# Patient Record
Sex: Female | Born: 1960 | Race: Black or African American | Hispanic: No | Marital: Single | Smoking: Never smoker
Health system: Southern US, Community
[De-identification: ages and names within clinical notes are randomized; demographics above are authoritative.]

## PROBLEM LIST (undated history)

## (undated) DIAGNOSIS — D0510 Intraductal carcinoma in situ of unspecified breast: Secondary | ICD-10-CM

## (undated) DIAGNOSIS — D219 Benign neoplasm of connective and other soft tissue, unspecified: Secondary | ICD-10-CM

## (undated) DIAGNOSIS — C50919 Malignant neoplasm of unspecified site of unspecified female breast: Secondary | ICD-10-CM

## (undated) DIAGNOSIS — C50119 Malignant neoplasm of central portion of unspecified female breast: Principal | ICD-10-CM

## (undated) HISTORY — PX: NASAL SINUS SURGERY: SHX719

## (undated) HISTORY — DX: Benign neoplasm of connective and other soft tissue, unspecified: D21.9

## (undated) HISTORY — DX: Malignant neoplasm of unspecified site of unspecified female breast: C50.919

## (undated) HISTORY — DX: Intraductal carcinoma in situ of unspecified breast: D05.10

---

## 2008-02-18 ENCOUNTER — Emergency Department (HOSPITAL_COMMUNITY): Admission: EM | Admit: 2008-02-18 | Discharge: 2008-02-18 | Payer: Self-pay | Admitting: Family Medicine

## 2008-08-07 ENCOUNTER — Ambulatory Visit (HOSPITAL_COMMUNITY): Admission: RE | Admit: 2008-08-07 | Discharge: 2008-08-07 | Payer: Self-pay | Admitting: Gastroenterology

## 2008-08-07 ENCOUNTER — Encounter (INDEPENDENT_AMBULATORY_CARE_PROVIDER_SITE_OTHER): Payer: Self-pay | Admitting: Gastroenterology

## 2008-09-29 HISTORY — PX: HERNIA REPAIR: SHX51

## 2008-11-01 ENCOUNTER — Emergency Department (HOSPITAL_COMMUNITY): Admission: EM | Admit: 2008-11-01 | Discharge: 2008-11-01 | Payer: Self-pay | Admitting: Family Medicine

## 2009-02-06 ENCOUNTER — Emergency Department (HOSPITAL_COMMUNITY): Admission: EM | Admit: 2009-02-06 | Discharge: 2009-02-06 | Payer: Self-pay | Admitting: Family Medicine

## 2009-04-26 ENCOUNTER — Emergency Department (HOSPITAL_COMMUNITY): Admission: EM | Admit: 2009-04-26 | Discharge: 2009-04-26 | Payer: Self-pay | Admitting: Emergency Medicine

## 2009-06-28 ENCOUNTER — Ambulatory Visit (HOSPITAL_COMMUNITY): Admission: RE | Admit: 2009-06-28 | Discharge: 2009-06-28 | Payer: Self-pay | Admitting: General Surgery

## 2010-12-25 ENCOUNTER — Emergency Department (HOSPITAL_COMMUNITY): Payer: 59

## 2010-12-25 ENCOUNTER — Emergency Department (HOSPITAL_COMMUNITY)
Admission: EM | Admit: 2010-12-25 | Discharge: 2010-12-25 | Disposition: A | Discharge status: Home / Self Care | Payer: 59 | Attending: Emergency Medicine | Admitting: Emergency Medicine

## 2010-12-25 ENCOUNTER — Encounter (HOSPITAL_COMMUNITY): Payer: Self-pay | Admitting: Radiology

## 2010-12-25 DIAGNOSIS — R197 Diarrhea, unspecified: Secondary | ICD-10-CM | POA: Insufficient documentation

## 2010-12-25 DIAGNOSIS — R1031 Right lower quadrant pain: Secondary | ICD-10-CM | POA: Insufficient documentation

## 2010-12-25 DIAGNOSIS — D259 Leiomyoma of uterus, unspecified: Secondary | ICD-10-CM | POA: Insufficient documentation

## 2010-12-25 LAB — URINALYSIS, ROUTINE W REFLEX MICROSCOPIC
Bilirubin Urine: NEGATIVE
Nitrite: NEGATIVE
Specific Gravity, Urine: 1.019 (ref 1.005–1.030)
pH: 6.5 (ref 5.0–8.0)

## 2010-12-25 LAB — WET PREP, GENITAL
Clue Cells Wet Prep HPF POC: NONE SEEN
Trich, Wet Prep: NONE SEEN

## 2010-12-25 LAB — COMPREHENSIVE METABOLIC PANEL
ALT: 15 U/L (ref 0–35)
AST: 17 U/L (ref 0–37)
Calcium: 9.4 mg/dL (ref 8.4–10.5)
Creatinine, Ser: 0.79 mg/dL (ref 0.4–1.2)
GFR calc Af Amer: >60 mL/min (ref >60)
Sodium: 139 mEq/L (ref 135–145)
Total Protein: 7.2 g/dL (ref 6.0–8.3)

## 2010-12-25 LAB — CBC
HCT: 34.1 % — ABNORMAL LOW (ref 36.0–46.0)
Hemoglobin: 11 g/dL — ABNORMAL LOW (ref 12.0–15.0)
RBC: 4.71 MIL/uL (ref 3.87–5.11)
WBC: 4.4 10*3/uL (ref 4.0–10.5)

## 2010-12-25 LAB — DIFFERENTIAL
Basophils Absolute: 0 10*3/uL (ref 0.0–0.1)
Lymphocytes Relative: 22 % (ref 12–46)
Neutro Abs: 3 10*3/uL (ref 1.7–7.7)
Neutrophils Relative %: 69 % (ref 43–77)

## 2010-12-25 MED ORDER — IOHEXOL 300 MG/ML  SOLN
95.0000 mL | Freq: Once | INTRAMUSCULAR | Status: AC | PRN
  Administered 2010-12-25: 95 mL via INTRAVENOUS

## 2010-12-26 LAB — GC/CHLAMYDIA PROBE AMP, GENITAL: GC Probe Amp, Genital: NEGATIVE

## 2011-01-03 LAB — DIFFERENTIAL
Basophils Relative: 0 % (ref 0–1)
Lymphocytes Relative: 37 % (ref 12–46)
Lymphs Abs: 1.5 10*3/uL (ref 0.7–4.0)
Monocytes Absolute: 0.5 10*3/uL (ref 0.1–1.0)
Monocytes Relative: 13 % — ABNORMAL HIGH (ref 3–12)
Neutro Abs: 2 10*3/uL (ref 1.7–7.7)
Neutrophils Relative %: 49 % (ref 43–77)

## 2011-01-03 LAB — CBC
Hemoglobin: 11 g/dL — ABNORMAL LOW (ref 12.0–15.0)
MCHC: 32.6 g/dL (ref 30.0–36.0)
RBC: 4.64 MIL/uL (ref 3.87–5.11)
WBC: 4 10*3/uL (ref 4.0–10.5)

## 2011-01-03 LAB — BASIC METABOLIC PANEL
CO2: 23 mEq/L (ref 19–32)
Calcium: 8.6 mg/dL (ref 8.4–10.5)
Chloride: 106 mEq/L (ref 96–112)
GFR calc Af Amer: >60 mL/min (ref >60)
Sodium: 137 mEq/L (ref 135–145)

## 2011-01-03 LAB — PREGNANCY, URINE: Preg Test, Ur: NEGATIVE

## 2011-01-05 LAB — CBC
HCT: 35.9 % — ABNORMAL LOW (ref 36.0–46.0)
Hemoglobin: 11.9 g/dL — ABNORMAL LOW (ref 12.0–15.0)
MCHC: 33.1 g/dL (ref 30.0–36.0)
MCV: 71.1 fL — ABNORMAL LOW (ref 78.0–100.0)
Platelets: 252 10*3/uL (ref 150–400)
RDW: 18.6 % — ABNORMAL HIGH (ref 11.5–15.5)

## 2011-01-05 LAB — BASIC METABOLIC PANEL
BUN: 11 mg/dL (ref 6–23)
CO2: 23 mEq/L (ref 19–32)
Chloride: 107 mEq/L (ref 96–112)
Glucose, Bld: 120 mg/dL — ABNORMAL HIGH (ref 70–99)
Potassium: 3.9 mEq/L (ref 3.5–5.1)
Sodium: 136 mEq/L (ref 135–145)

## 2011-01-05 LAB — COMPREHENSIVE METABOLIC PANEL
ALT: 13 U/L (ref 0–35)
Alkaline Phosphatase: 67 U/L (ref 39–117)
BUN: 12 mg/dL (ref 6–23)
CO2: 21 mEq/L (ref 19–32)
Calcium: 9.1 mg/dL (ref 8.4–10.5)
GFR calc non Af Amer: >60 mL/min (ref >60)
Glucose, Bld: 113 mg/dL — ABNORMAL HIGH (ref 70–99)
Potassium: 3.9 mEq/L (ref 3.5–5.1)
Sodium: 136 mEq/L (ref 135–145)

## 2011-01-05 LAB — URINALYSIS, ROUTINE W REFLEX MICROSCOPIC
Bilirubin Urine: NEGATIVE
Ketones, ur: 15 mg/dL — AB
Nitrite: NEGATIVE
Protein, ur: NEGATIVE mg/dL
pH: 5.5 (ref 5.0–8.0)

## 2011-01-05 LAB — DIFFERENTIAL
Basophils Absolute: 0 10*3/uL (ref 0.0–0.1)
Eosinophils Absolute: 0 10*3/uL (ref 0.0–0.7)
Eosinophils Relative: 1 % (ref 0–5)
Lymphocytes Relative: 38 % (ref 12–46)
Monocytes Absolute: 0.4 10*3/uL (ref 0.1–1.0)

## 2011-01-05 LAB — LIPASE, BLOOD: Lipase: 18 U/L (ref 11–59)

## 2011-01-05 LAB — GC/CHLAMYDIA PROBE AMP, GENITAL: GC Probe Amp, Genital: NEGATIVE

## 2011-02-11 NOTE — Op Note (Signed)
NAME:  Angelica Hogan, Angelica Hogan NO.:  [id number]   MEDICAL RECORD NO.:  [id number]          PATIENT TYPE:  AMB   LOCATION:  ENDO                         FACILITY:  [facility]   PHYSICIAN:  Anselmo Rod, M.D.  DATE OF BIRTH:  July 16, 1961   DATE OF PROCEDURE:  08/07/2008  DATE OF DISCHARGE:                               OPERATIVE REPORT   PROCEDURE PERFORMED:  Colonoscopy with multiple cold biopsies and snare  polypectomy x1.   ENDOSCOPIST:  Anselmo Rod.   INSTRUMENT USED:  Pentax video colonoscope.   INDICATIONS FOR PROCEDURE:  A 50 year old white female with a history of  change in bowel habits, worsening constipation and abdominal pain,  undergoing colonoscopy to rule out colonic polyps, masses, etc.   PREPROCEDURE PREPARATION:  Informed consent was procured from the  patient.  The patient fasted for 8 hours prior to the procedure after  being prepped with a bottle of magnesium citrate and a gallon of  NuLytely the night prior to the procedure.  The risks and benefits of  the procedure including 10% miss rates of cancer and polyps were  discussed with the patient as well.   PREPROCEDURE PHYSICAL:  The patient had stable vital signs.  Neck  supple.  Lungs clear to auscultation.  S1, S2 regular.  Abdomen soft  with normal bowel sounds.   DESCRIPTION OF PROCEDURE:  The patient was placed in the left lateral  decubitus position and sedated with 100 mcg of fentanyl, 10 mg of Versed  and 5  mg of Benadryl given in slow and incremental doses.  Once the  patient was adequately sedated and maintained on low-flow oxygen and  continuous cardiac monitoring, the Pentax video colonoscope was advanced  from the rectum to the cecum, where a flat polyp was removed by hot  snare (150/15 from the cecum).  A small sessile polyp was removed by  cold biopsy x2 from the rectosigmoid colon.  The rest of the exam was  unremarkable.  No other masses or polyps were seen.  The orifice and  ileocecal valve were clearly visualized and photographed.  The terminal  ileum appeared healthy.  Retroflexion revealed no abnormalities.  The  patient tolerated the procedure well without immediate complications.   IMPRESSION:  1. A flat polyp removed by hot snare from the cecum.  2. A small sessile rectosigmoid polyp removed by cold biopsy x  4.  3. Otherwise normal exam of the terminal ileum.   RECOMMENDATIONS:  1. Await pathology results.  2. Avoid all nonsteroidals for the next 2 weeks.  3. Outpatient followup in the next 2 weeks and further      recommendations.      Anselmo Rod, M.D.  Electronically Signed     JNM/MEDQ  D:  08/07/2008  T:  08/08/2008  Job:  [id number]

## 2011-04-30 ENCOUNTER — Other Ambulatory Visit: Payer: Self-pay | Admitting: Radiology

## 2011-04-30 DIAGNOSIS — D0510 Intraductal carcinoma in situ of unspecified breast: Secondary | ICD-10-CM

## 2011-04-30 HISTORY — DX: Intraductal carcinoma in situ of unspecified breast: D05.10

## 2011-05-02 ENCOUNTER — Other Ambulatory Visit: Payer: Self-pay | Admitting: Radiology

## 2011-05-02 DIAGNOSIS — C50919 Malignant neoplasm of unspecified site of unspecified female breast: Secondary | ICD-10-CM

## 2011-05-02 DIAGNOSIS — C50911 Malignant neoplasm of unspecified site of right female breast: Secondary | ICD-10-CM

## 2011-05-05 ENCOUNTER — Ambulatory Visit (HOSPITAL_COMMUNITY)
Admission: RE | Admit: 2011-05-05 | Discharge: 2011-05-05 | Disposition: A | Discharge status: Home / Self Care | Payer: 59 | Source: Ambulatory Visit | Attending: Radiology | Admitting: Radiology

## 2011-05-05 DIAGNOSIS — Y839 Surgical procedure, unspecified as the cause of abnormal reaction of the patient, or of later complication, without mention of misadventure at the time of the procedure: Secondary | ICD-10-CM | POA: Insufficient documentation

## 2011-05-05 DIAGNOSIS — IMO0002 Reserved for concepts with insufficient information to code with codable children: Secondary | ICD-10-CM | POA: Insufficient documentation

## 2011-05-05 DIAGNOSIS — C50911 Malignant neoplasm of unspecified site of right female breast: Secondary | ICD-10-CM

## 2011-05-05 DIAGNOSIS — D0590 Unspecified type of carcinoma in situ of unspecified breast: Secondary | ICD-10-CM | POA: Insufficient documentation

## 2011-05-05 MED ORDER — GADOBENATE DIMEGLUMINE 529 MG/ML IV SOLN
20.0000 mL | Freq: Once | INTRAVENOUS | Status: AC | PRN
  Administered 2011-05-05: 20 mL via INTRAVENOUS

## 2011-05-06 ENCOUNTER — Other Ambulatory Visit: Payer: 59

## 2011-05-07 ENCOUNTER — Encounter (INDEPENDENT_AMBULATORY_CARE_PROVIDER_SITE_OTHER): Payer: Self-pay | Admitting: Surgery

## 2011-05-07 ENCOUNTER — Other Ambulatory Visit: Payer: Self-pay | Admitting: Oncology

## 2011-05-07 ENCOUNTER — Ambulatory Visit (HOSPITAL_BASED_OUTPATIENT_CLINIC_OR_DEPARTMENT_OTHER): Payer: 59 | Admitting: Surgery

## 2011-05-07 ENCOUNTER — Encounter (HOSPITAL_BASED_OUTPATIENT_CLINIC_OR_DEPARTMENT_OTHER): Payer: 59 | Admitting: Oncology

## 2011-05-07 VITALS — BP 145/78 | HR 74 | Temp 99.0°F | Resp 20 | Ht 65.2 in | Wt 186.7 lb

## 2011-05-07 DIAGNOSIS — D0590 Unspecified type of carcinoma in situ of unspecified breast: Secondary | ICD-10-CM

## 2011-05-07 LAB — CBC WITH DIFFERENTIAL/PLATELET
EOS%: 2.6 % (ref 0.0–7.0)
Eosinophils Absolute: 0.1 10*3/uL (ref 0.0–0.5)
LYMPH%: 33.5 % (ref 14.0–49.7)
MCH: 23.6 pg — ABNORMAL LOW (ref 25.1–34.0)
MCHC: 32.1 g/dL (ref 31.5–36.0)
MCV: 73.4 fL — ABNORMAL LOW (ref 79.5–101.0)
MONO%: 8.5 % (ref 0.0–14.0)
NEUT#: 2.1 10*3/uL (ref 1.5–6.5)
Platelets: 235 10*3/uL (ref 145–400)
RBC: 4.96 10*6/uL (ref 3.70–5.45)
RDW: 18.1 % — ABNORMAL HIGH (ref 11.2–14.5)
nRBC: 0 % (ref 0–0)

## 2011-05-07 LAB — COMPREHENSIVE METABOLIC PANEL
ALT: 11 U/L (ref 0–35)
Alkaline Phosphatase: 47 U/L (ref 39–117)
CO2: 23 mEq/L (ref 19–32)
Creatinine, Ser: 0.73 mg/dL (ref 0.50–1.10)
Total Bilirubin: 0.3 mg/dL (ref 0.3–1.2)

## 2011-05-07 NOTE — Assessment & Plan Note (Signed)
To be seen today for [place] image of 2nd right breast lesion at [facility]. Genetic testing 05/12/11. Schedule lumpectomy early September.

## 2011-05-07 NOTE — Progress Notes (Signed)
Chief Complaint  Patient presents with  . Breast Cancer    Angelica Hogan is a 50 y.o. AA female who is a patient of Angelica Hogan. and comes to me today for Right breast cancer.  The patient sees Dr. Lisbeth Hogan has her OB/GYN doctor. She went for her routine mammogram. She did not feel a mass in her breast. She has had multiple members of her family has had breast cancer. This includes her grandmother, 2 aunts on her father's side, and 2 cousins on her father's side.  She is on Loestrin. She denies to stop this. She still having regular periods.  LMP 04/23/11.  She does have uterine fibroids. She has one son, Angelica Hogan, who accompanied her today.   Path (side, TNM): Right, Low Grade DCIS Surgery: pending  Date: pending Size of tumor: getting a 2nd biopsy  Nodes:  ER: 100% PR: 16% Ki67:   HER2Neu:   Medical Oncologist: Angelica Hogan Radiation Oncologist: Angelica Hogan   Past Medical History  Diagnosis Date  . Fibroids     Past Surgical History  Procedure Date  . Hernia repair 2010  . Nasal sinus surgery     1980's    Current Outpatient Prescriptions  Medication Sig    . acetaminophen (TYLENOL) 325 MG tablet Take 650 mg by mouth as needed.        . calcium carbonate (TUMS - DOSED IN MG ELEMENTAL CALCIUM) 500 MG chewable tablet Chew 1 tablet by mouth as needed.         Review of Systems: Skin:  Angelica history of rash.  Angelica history of abnormal moles. Infection:  Angelica history of hepatitis or HIV.  Angelica history of MRSA. Neurologic:  Angelica history of stroke.  Angelica history of seizure.  Angelica history of headaches. Cardiac:  Angelica history of hypertension. Angelica history of heart disease.  Angelica history of prior cardiac catheterization.  Angelica history of seeing a cardiologist. Pulmonary:  Does not smoke cigarettes.  Angelica asthma or bronchitis.  Endocrine:  Angelica diabetes. Angelica thyroid disease. Gastrointestinal:  Angelica history of stomach disease.  Angelica history of liver disease.  Angelica history of gall bladder disease.  Angelica  history of pancreas disease.  Angelica history of colon disease.  Umbilical hernia repaired by Dr. Dwain Hogan in 2010. Urologic:  Angelica history of kidney stones.  Angelica history of bladder infections. Musculoskeletal:  Angelica history of joint or back disease. Hematologic:  Angelica bleeding disorder.  Angelica history of anemia.  Not anticoagulated.   Angelica Known Allergies  SOCIAL and FAMILY HISTORY: Works at [employer] as Scientist, physiological. Accompanied by son, Angelica Hogan.  She is divorced.  PHYSICAL EXAM: BP 145/78  Pulse 74  Temp 99 F (37.2 C)  Resp 20  Ht 5' 5.2" (1.656 m)  Wt 186 lb 11.2 oz (84.687 kg)  BMI 30.88 kg/m2  HEENT: Normal. Pupils equal. Normal dentition. Neck: Supple. Angelica thyroid mass. Lymph Nodes:  Angelica supraclavicular or cervical nodes. Angelica axillary adenopathy. Breast: She has a bruise in her right breast at the 12 position. She has a small mass associate a probable hematoma. I feel Angelica other mass in her left or right breast. She has Angelica nipple discharge.  Lungs: Clear and symmetric. Heart:  RRR. Angelica murmur. Abdomen: Angelica mass. Angelica tenderness. Angelica hernia. Normal bowel sounds.  Angelica abdominal scars. Rectal: Not done. Extremities:  Good strength in upper and lower extremities. Neurologic:  Grossly intact to motor and sensory function.   DATA REVIEWED: [facility] and x-rays reviewed.  ASSESSMENT and PLAN: 1.  Right breast Cancer, DCIS, low grade  Candidate for lumpectomy  Does not need SLNBx  I discussed the indications and complications of lumpectomy with the patient and her son. I discussed needle localization of the lumpectomy. She did not need a sentinel lymph node biopsy. I discussed the idea of margins.  The risks of surgery includes bleeding, infection, the need for further surgery, and nerve injury.  The surgery is usually done as an outpatient.  I will plan to schedule her surgery in early September to allow for the results of the genetic testing to be reviewed.  2.   Second area in right breast on MRI  For [place] eval today at [facility]  3.  Genetic testing  To be seen 05/12/2011  4.  To stop estrogen

## 2011-05-08 ENCOUNTER — Other Ambulatory Visit: Payer: 59

## 2011-05-12 ENCOUNTER — Encounter: Payer: 59 | Admitting: Genetic Counselor

## 2011-06-10 ENCOUNTER — Ambulatory Visit
Admission: RE | Admit: 2011-06-10 | Discharge: 2011-06-10 | Disposition: A | Discharge status: Home / Self Care | Payer: 59 | Source: Ambulatory Visit | Attending: Surgery | Admitting: Surgery

## 2011-06-10 ENCOUNTER — Other Ambulatory Visit (INDEPENDENT_AMBULATORY_CARE_PROVIDER_SITE_OTHER): Payer: Self-pay | Admitting: Surgery

## 2011-06-10 ENCOUNTER — Encounter (HOSPITAL_BASED_OUTPATIENT_CLINIC_OR_DEPARTMENT_OTHER)
Admission: RE | Admit: 2011-06-10 | Discharge: 2011-06-10 | Disposition: A | Discharge status: Home / Self Care | Payer: 59 | Source: Ambulatory Visit | Attending: Surgery | Admitting: Surgery

## 2011-06-10 DIAGNOSIS — Z01811 Encounter for preprocedural respiratory examination: Secondary | ICD-10-CM

## 2011-06-10 LAB — DIFFERENTIAL
Basophils Relative: 1 % (ref 0–1)
Eosinophils Absolute: 0 10*3/uL (ref 0.0–0.7)
Neutrophils Relative %: 44 % (ref 43–77)

## 2011-06-10 LAB — BASIC METABOLIC PANEL
CO2: 24 mEq/L (ref 19–32)
Calcium: 9.2 mg/dL (ref 8.4–10.5)
Chloride: 105 mEq/L (ref 96–112)
Sodium: 139 mEq/L (ref 135–145)

## 2011-06-10 LAB — CBC
Platelets: 226 10*3/uL (ref 150–400)
RBC: 4.48 MIL/uL (ref 3.87–5.11)
WBC: 3.3 10*3/uL — ABNORMAL LOW (ref 4.0–10.5)

## 2011-06-11 NOTE — Progress Notes (Signed)
Quick Note:  These labs are OK for surgery. ______ 

## 2011-06-12 ENCOUNTER — Ambulatory Visit (HOSPITAL_BASED_OUTPATIENT_CLINIC_OR_DEPARTMENT_OTHER)
Admission: RE | Admit: 2011-06-12 | Discharge: 2011-06-12 | Disposition: A | Discharge status: Home / Self Care | Payer: 59 | Source: Ambulatory Visit | Attending: Surgery | Admitting: Surgery

## 2011-06-12 ENCOUNTER — Other Ambulatory Visit (INDEPENDENT_AMBULATORY_CARE_PROVIDER_SITE_OTHER): Payer: Self-pay | Admitting: Surgery

## 2011-06-12 DIAGNOSIS — Z01812 Encounter for preprocedural laboratory examination: Secondary | ICD-10-CM | POA: Insufficient documentation

## 2011-06-12 DIAGNOSIS — K219 Gastro-esophageal reflux disease without esophagitis: Secondary | ICD-10-CM | POA: Insufficient documentation

## 2011-06-12 DIAGNOSIS — Z01818 Encounter for other preprocedural examination: Secondary | ICD-10-CM | POA: Insufficient documentation

## 2011-06-12 DIAGNOSIS — D0590 Unspecified type of carcinoma in situ of unspecified breast: Secondary | ICD-10-CM | POA: Insufficient documentation

## 2011-06-12 DIAGNOSIS — E669 Obesity, unspecified: Secondary | ICD-10-CM | POA: Insufficient documentation

## 2011-06-12 DIAGNOSIS — Z01810 Encounter for preprocedural cardiovascular examination: Secondary | ICD-10-CM | POA: Insufficient documentation

## 2011-06-12 HISTORY — PX: BREAST LUMPECTOMY: SHX2

## 2011-06-16 ENCOUNTER — Encounter (INDEPENDENT_AMBULATORY_CARE_PROVIDER_SITE_OTHER): Payer: Self-pay

## 2011-06-17 NOTE — Op Note (Signed)
  NAME:  Angelica Hogan, Angelica Hogan NO.:  [id number]  MEDICAL RECORD NO.:  [id number]  LOCATION:                                 FACILITY:  PHYSICIAN:  Sandria Bales. Ezzard Standing, M.D.  DATE OF BIRTH:  [birth year]  DATE OF PROCEDURE:  06/12/2011                              OPERATIVE REPORT   DATE OF SURGERY:  June 12, 2011.  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  POSTOPERATIVE DIAGNOSIS:  Ductal carcinoma in situ of the right breast at 12 o'clock position ("almost subareolar").  PROCEDURE:  Right breast lumpectomy, needle localization.  SURGEON:  Sandria Bales. Ezzard Standing, MD.  FIRST ASSISTANT:  None.  ANESTHESIA:  General endotracheal with 30 mL of 0.25% Marcaine.  COMPLICATIONS:  None.  INDICATIONS FOR PROCEDURE:  [name] is a 50 year old African American female who presented with a ductal carcinoma in situ of the right breast.  This is ER 100%, PR 16%, she has seen Dr. Pierce Crane from both medical and oncology standpoint, Dr. Lurline Hare for Radiation Oncology and sees Dr. Noland Fordyce for her sort of primary care.  I discussed with the patient about options for treatment.  I discussed about proceeding with lumpectomy.  I do not think she needs a sentinel lymph node biopsy.  I discussed the indications, potential complications of lumpectomy.  Potential complication of lumpectomy include, but are not limited to, bleeding, infection, the need for possible more surgery, and nerve injury.  OPERATIVE COURSE:  The patient came from [facility] with a wire in her right breast coming from the 12-1 o'clock position about 45 cm above the areola.  She was placed under general anesthesia.  Her right breast was prepped with ChloraPrep and sterilely draped.    A time-out was held and surgical checklist run.  It looked like this clip in the microcalcifications were sort of deep in the breast out through a 4 cm  just above the edge of the 12 o'clock position of the right areola.  I excised a block of breast tissue approximately 3-4 cm diameter.  I painted this with a six-color paint kit.  I shot a specimen mammogram which confirmed the clip in the wire in the middle of the specimen.  This was sent to pathology for permanent analysis.  I irrigated the wound with saline.  I did place clips at the 3, 6, 9 o'clock and 12 o'clock positions and 1 deep.  I then closed the wound with interrupted 3-0 Vicryl sutures and the skin with a 5-0 Vicryl suture, painted the skin with Dermabond and sterilely dressed it.  The patient tolerated procedure well, was transported to recovery room in good condition.  Sponge and needle counts were correct at the end of the case.   Sandria Bales. Ezzard Standing, M.D., FACS    DHN/MEDQ  D:  06/12/2011  T:  06/12/2011  Job:  [id number]  cc:   Lendon Colonel, MD Pierce Crane, M.D., F.R.C.P.C. Lurline Hare, M.D.  Electronically Signed by Ovidio Kin M.D. on 06/17/2011 02:40:45 PM

## 2011-06-18 ENCOUNTER — Encounter (INDEPENDENT_AMBULATORY_CARE_PROVIDER_SITE_OTHER): Payer: Self-pay | Admitting: Surgery

## 2011-06-18 ENCOUNTER — Ambulatory Visit (INDEPENDENT_AMBULATORY_CARE_PROVIDER_SITE_OTHER): Payer: 59 | Admitting: Surgery

## 2011-06-18 VITALS — BP 124/82 | HR 80 | Temp 97.5°F | Resp 16 | Ht 65.0 in | Wt 187.0 lb

## 2011-06-18 DIAGNOSIS — Z853 Personal history of malignant neoplasm of breast: Secondary | ICD-10-CM

## 2011-06-18 NOTE — Progress Notes (Signed)
MDBC  ASSESSMENT AND PLAN:  1.  Right breast cancer, DCIS.   0.5 cm in diameter.  ER/PR positive.  To see Drs. Rubin/Wentworth regarding further treatment.  Doing well post op.  I wrote he a note to return to work 06/23/2011.  2.  Second area in right breast on MRI            [place] negative.  To follow.  3.  Genetic testing             Negative.    HPI:  Angelica Hogan is a 50 y.o. AA female who is a patient of Dr. Theodoro Kos on file and comes to me today for Right breast cancer.   The patient sees Dr. Lisbeth Renshaw has her OB/GYN doctor. She went for her routine mammogram which showed an abnormality in the right beast.  She underwent a right breast lumpectomy 06/12/2011 and has an excellent path report.  She has done well with little discomfort.  She has an appointment to see Dr. Donnie Coffin.   Path (side, TNM): Tis, Right, Low Grade DCIS  Surgery: right breast lumpectomy  Date: 06/12/2011  Size of tumor:  0.5 cm Nodes: -/- ER: 100%  PR: 70%  Ki67:    HER2Neu:  Medical Oncologist: Donnie Coffin   Radiation Oncologist: Michell Heinrich  SOCIAL and FAMILY HISTORY: Works at [employer] as Scientist, physiological.  PHYSICAL EXAM: BP 124/82  Pulse 80  Temp(Src) 97.5 F (36.4 C) (Temporal)  Resp 16  Ht 5\' 5"  (1.651 m)  Wt 187 lb (84.823 kg)  BMI 31.12 kg/m2  Right breast:  Wound looks good.  Minimal bruising.

## 2011-06-26 ENCOUNTER — Ambulatory Visit
Admission: RE | Admit: 2011-06-26 | Discharge: 2011-06-26 | Disposition: A | Discharge status: Home / Self Care | Payer: 59 | Source: Ambulatory Visit | Attending: Radiation Oncology | Admitting: Radiation Oncology

## 2011-06-26 DIAGNOSIS — D0590 Unspecified type of carcinoma in situ of unspecified breast: Secondary | ICD-10-CM | POA: Insufficient documentation

## 2011-06-26 DIAGNOSIS — Z510 Encounter for antineoplastic radiation therapy: Secondary | ICD-10-CM | POA: Insufficient documentation

## 2011-07-11 ENCOUNTER — Encounter (HOSPITAL_BASED_OUTPATIENT_CLINIC_OR_DEPARTMENT_OTHER): Payer: 59 | Admitting: Oncology

## 2011-07-11 ENCOUNTER — Encounter: Payer: Self-pay | Admitting: *Deleted

## 2011-07-11 DIAGNOSIS — C50119 Malignant neoplasm of central portion of unspecified female breast: Secondary | ICD-10-CM

## 2011-07-11 DIAGNOSIS — D0590 Unspecified type of carcinoma in situ of unspecified breast: Secondary | ICD-10-CM

## 2011-07-14 ENCOUNTER — Encounter: Payer: Self-pay | Admitting: *Deleted

## 2011-07-15 ENCOUNTER — Encounter: Payer: 59 | Admitting: Oncology

## 2011-08-04 ENCOUNTER — Ambulatory Visit
Admission: RE | Admit: 2011-08-04 | Discharge: 2011-08-04 | Disposition: A | Discharge status: Home / Self Care | Payer: 59 | Source: Ambulatory Visit | Attending: Radiation Oncology | Admitting: Radiation Oncology

## 2011-08-05 ENCOUNTER — Ambulatory Visit
Admission: RE | Admit: 2011-08-05 | Discharge: 2011-08-05 | Disposition: A | Discharge status: Home / Self Care | Payer: 59 | Source: Ambulatory Visit | Attending: Radiation Oncology | Admitting: Radiation Oncology

## 2011-08-05 VITALS — Wt 190.0 lb

## 2011-08-05 DIAGNOSIS — D0590 Unspecified type of carcinoma in situ of unspecified breast: Secondary | ICD-10-CM

## 2011-08-05 NOTE — Progress Notes (Signed)
Weekly Management Note Current Dose: Gy  Projected Dose: Gy   Narrative:  The patient presents for routine under treatment assessment.  CBCT/MVCT images/Port film x-rays were reviewed.  The chart was checked. Some irritation of rt breast. Nausea in am. Eating more than normal.   Physical Findings: Unchanged. Some thin skin of rt breast. Small amount of breakdown in rt inframmary fold. Wt190 lb (86.183 kg)  Impression:  The patient is tolerating radiation.  Plan:  Continue treatment as planned. Stop underwire bra. Use Zantac bid for possible GERD.

## 2011-08-05 NOTE — Progress Notes (Signed)
Slight erythema ,tanning r breast, slight fatigue, hearty appetite, skin intact,no c/o pain

## 2011-08-06 ENCOUNTER — Encounter: Payer: Self-pay | Admitting: *Deleted

## 2011-08-06 ENCOUNTER — Ambulatory Visit
Admission: RE | Admit: 2011-08-06 | Discharge: 2011-08-06 | Disposition: A | Discharge status: Home / Self Care | Payer: 59 | Source: Ambulatory Visit | Attending: Radiation Oncology | Admitting: Radiation Oncology

## 2011-08-06 ENCOUNTER — Other Ambulatory Visit: Payer: Self-pay | Admitting: *Deleted

## 2011-08-06 DIAGNOSIS — D0590 Unspecified type of carcinoma in situ of unspecified breast: Secondary | ICD-10-CM

## 2011-08-06 NOTE — Progress Notes (Signed)
[id number] Week 3 visit Patient in to clinic today for radiation treatment. Met with patient prior to treatment to obtain photographs per protocol. Patient reports that she is using Radiaplex on her skin. Skin shows minimal redness/irritation and she denies and skin discomfort (grade 1 skin changes). Patient given appointment for last day research blood and urine labs to be done on 09/01/2011 prior to radiation therapy to allow time for processing samples. Patient aware that she will also complete questionnaires and have photographs taken on that day. Patient is in agreement with plan.

## 2011-08-07 ENCOUNTER — Ambulatory Visit
Admission: RE | Admit: 2011-08-07 | Discharge: 2011-08-07 | Disposition: A | Discharge status: Home / Self Care | Payer: 59 | Source: Ambulatory Visit | Attending: Radiation Oncology | Admitting: Radiation Oncology

## 2011-08-08 ENCOUNTER — Ambulatory Visit
Admission: RE | Admit: 2011-08-08 | Discharge: 2011-08-08 | Disposition: A | Discharge status: Home / Self Care | Payer: 59 | Source: Ambulatory Visit | Attending: Radiation Oncology | Admitting: Radiation Oncology

## 2011-08-11 ENCOUNTER — Ambulatory Visit
Admission: RE | Admit: 2011-08-11 | Discharge: 2011-08-11 | Disposition: A | Discharge status: Home / Self Care | Payer: 59 | Source: Ambulatory Visit | Attending: Radiation Oncology | Admitting: Radiation Oncology

## 2011-08-12 ENCOUNTER — Ambulatory Visit
Admission: RE | Admit: 2011-08-12 | Discharge: 2011-08-12 | Disposition: A | Discharge status: Home / Self Care | Payer: 59 | Source: Ambulatory Visit | Attending: Radiation Oncology | Admitting: Radiation Oncology

## 2011-08-12 VITALS — Wt 190.4 lb

## 2011-08-12 DIAGNOSIS — C50119 Malignant neoplasm of central portion of unspecified female breast: Secondary | ICD-10-CM

## 2011-08-12 NOTE — Progress Notes (Signed)
Weekly Management Note Current Dose: 36 Gy  Projected Dose:61 Gy   Narrative:  The patient presents for routine under treatment assessment.  CBCT/MVCT images/Port film x-rays were reviewed.  The chart was checked. Doing well. Some irritation over the weekend. Using radiaplex.  Physical Findings: Weight: 190 lb 6.4 oz (86.365 kg). Slightly tan on right side. No desquamation.   Impression:  The patient is tolerating radiation.  Plan:  Continue treatment as planned.

## 2011-08-12 NOTE — Progress Notes (Signed)
Pt has no c/o, some fatigue.

## 2011-08-13 ENCOUNTER — Ambulatory Visit
Admission: RE | Admit: 2011-08-13 | Discharge: 2011-08-13 | Disposition: A | Discharge status: Home / Self Care | Payer: 59 | Source: Ambulatory Visit | Attending: Radiation Oncology | Admitting: Radiation Oncology

## 2011-08-14 ENCOUNTER — Ambulatory Visit
Admission: RE | Admit: 2011-08-14 | Discharge: 2011-08-14 | Disposition: A | Discharge status: Home / Self Care | Payer: 59 | Source: Ambulatory Visit | Attending: Radiation Oncology | Admitting: Radiation Oncology

## 2011-08-15 ENCOUNTER — Ambulatory Visit
Admission: RE | Admit: 2011-08-15 | Discharge: 2011-08-15 | Disposition: A | Discharge status: Home / Self Care | Payer: 59 | Source: Ambulatory Visit | Attending: Radiation Oncology | Admitting: Radiation Oncology

## 2011-08-16 ENCOUNTER — Ambulatory Visit
Admission: RE | Admit: 2011-08-16 | Discharge: 2011-08-16 | Disposition: A | Discharge status: Home / Self Care | Payer: 59 | Source: Ambulatory Visit | Attending: Radiation Oncology | Admitting: Radiation Oncology

## 2011-08-18 ENCOUNTER — Ambulatory Visit
Admission: RE | Admit: 2011-08-18 | Discharge: 2011-08-18 | Disposition: A | Discharge status: Home / Self Care | Payer: 59 | Source: Ambulatory Visit | Attending: Radiation Oncology | Admitting: Radiation Oncology

## 2011-08-18 ENCOUNTER — Encounter: Payer: Self-pay | Admitting: Radiation Oncology

## 2011-08-18 VITALS — Wt 190.8 lb

## 2011-08-18 DIAGNOSIS — C50119 Malignant neoplasm of central portion of unspecified female breast: Secondary | ICD-10-CM

## 2011-08-18 HISTORY — DX: Malignant neoplasm of central portion of unspecified female breast: C50.119

## 2011-08-18 NOTE — Progress Notes (Signed)
[facility] Radiation Oncology Weekly Treatment Note    Name: Angelica Hogan Date: 08/18/2011 MRN: [id number] DOB: 1960-10-27  Status:outpatient    Current dose: 45Gy  Current fraction: 25  Planned dose: 61Gy  Planned fraction:33Gy   MEDICATIONS:  Current Outpatient Prescriptions  Medication Sig Dispense Refill  . acetaminophen (TYLENOL) 325 MG tablet Take 650 mg by mouth as needed.        . calcium carbonate (TUMS - DOSED IN MG ELEMENTAL CALCIUM) 500 MG chewable tablet Chew 1 tablet by mouth as needed.        . ranitidine (ZANTAC) 150 MG tablet Take 150 mg by mouth 2 (two) times daily.           ALLERGIES: Review of patient's allergies indicates no known allergies.     NARRATIVE: Angelica Hogan was seen today for weekly treatment management. The chart was checked and port films images were reviewed. She is doing well, but reports skin irritation, particularly at inframammary fold.  PHYSICAL EXAMINATION: weight is 190 lb 12.8 oz (86.546 kg).      Breast is hyperpigmented throughout with erythema.  Skin intact.  ASSESSMENT: Patient tolerating treatments well.   PLAN: Continue treatment as planned. Start boost tomorrow.  Use 1% hydrocortisone cream in pruritic areas.

## 2011-08-18 NOTE — Progress Notes (Signed)
Has completed 25/25 right breat treatments. Starts boost on 08/19/11. Breast with moderate discoloration and itching. To continue to apply radiaplex gel but may use hydrocortisone !%.

## 2011-08-19 ENCOUNTER — Ambulatory Visit
Admission: RE | Admit: 2011-08-19 | Discharge: 2011-08-19 | Disposition: A | Discharge status: Home / Self Care | Payer: 59 | Source: Ambulatory Visit | Attending: Radiation Oncology | Admitting: Radiation Oncology

## 2011-08-19 ENCOUNTER — Other Ambulatory Visit: Payer: Self-pay | Admitting: Radiation Oncology

## 2011-08-19 DIAGNOSIS — D0590 Unspecified type of carcinoma in situ of unspecified breast: Secondary | ICD-10-CM

## 2011-08-19 DIAGNOSIS — C50119 Malignant neoplasm of central portion of unspecified female breast: Secondary | ICD-10-CM

## 2011-08-19 NOTE — Progress Notes (Signed)
Gave pt Hydrogel pads per Dr Michell Heinrich. She states the area "just burns". Radiaplex. Some fatigue.

## 2011-08-20 ENCOUNTER — Ambulatory Visit
Admission: RE | Admit: 2011-08-20 | Discharge: 2011-08-20 | Disposition: A | Discharge status: Home / Self Care | Payer: 59 | Source: Ambulatory Visit | Attending: Radiation Oncology | Admitting: Radiation Oncology

## 2011-08-22 ENCOUNTER — Ambulatory Visit: Payer: 59

## 2011-08-25 ENCOUNTER — Ambulatory Visit: Payer: 59

## 2011-08-26 ENCOUNTER — Ambulatory Visit
Admission: RE | Admit: 2011-08-26 | Discharge: 2011-08-26 | Disposition: A | Discharge status: Home / Self Care | Payer: 59 | Source: Ambulatory Visit | Attending: Radiation Oncology | Admitting: Radiation Oncology

## 2011-08-26 DIAGNOSIS — C50119 Malignant neoplasm of central portion of unspecified female breast: Secondary | ICD-10-CM

## 2011-08-26 DIAGNOSIS — D0590 Unspecified type of carcinoma in situ of unspecified breast: Secondary | ICD-10-CM

## 2011-08-26 MED ORDER — BIAFINE EX EMUL
CUTANEOUS | Status: DC | PRN
  Administered 2011-08-26: 17:00:00 via TOPICAL

## 2011-08-26 NOTE — Progress Notes (Signed)
Weekly Management Note Current Dose: 51  Gy  Projected Dose: 61 Gy   Narrative:  The patient presents for routine under treatment assessment.  CBCT/MVCT images/Port film x-rays were reviewed.  The chart was checked. Doing well. No distress. Under breast is healing with some irritation. Hydrogel helped. Too much caffeine and was sick yesterday so no tx.  Physical Findings: Weight: 189 lb 6.4 oz (85.911 kg). Moist desquamation better in inframammary fold. Rest of breast is dark.   Impression:  The patient is tolerating radiation.  Plan:  Continue treatment as planned.Continue radiaplex.

## 2011-08-26 NOTE — Progress Notes (Signed)
Pt c/o itchiness under r breast; gave her Biafine lotion. Advised she may try cortisone cream 1% as well. Using hydrogel pads w/good relief. Some mild fatigue.

## 2011-08-27 ENCOUNTER — Ambulatory Visit
Admission: RE | Admit: 2011-08-27 | Discharge: 2011-08-27 | Disposition: A | Discharge status: Home / Self Care | Payer: 59 | Source: Ambulatory Visit | Attending: Radiation Oncology | Admitting: Radiation Oncology

## 2011-08-28 ENCOUNTER — Ambulatory Visit
Admission: RE | Admit: 2011-08-28 | Discharge: 2011-08-28 | Disposition: A | Discharge status: Home / Self Care | Payer: 59 | Source: Ambulatory Visit | Attending: Radiation Oncology | Admitting: Radiation Oncology

## 2011-08-29 ENCOUNTER — Ambulatory Visit
Admission: RE | Admit: 2011-08-29 | Discharge: 2011-08-29 | Disposition: A | Discharge status: Home / Self Care | Payer: 59 | Source: Ambulatory Visit | Attending: Radiation Oncology | Admitting: Radiation Oncology

## 2011-09-01 ENCOUNTER — Ambulatory Visit
Admission: RE | Admit: 2011-09-01 | Discharge: 2011-09-01 | Disposition: A | Discharge status: Home / Self Care | Payer: 59 | Source: Ambulatory Visit | Attending: Radiation Oncology | Admitting: Radiation Oncology

## 2011-09-01 ENCOUNTER — Encounter: Payer: Self-pay | Admitting: *Deleted

## 2011-09-01 ENCOUNTER — Other Ambulatory Visit: Payer: Self-pay | Admitting: Oncology

## 2011-09-01 ENCOUNTER — Other Ambulatory Visit: Payer: 59 | Admitting: Lab

## 2011-09-01 DIAGNOSIS — C50119 Malignant neoplasm of central portion of unspecified female breast: Secondary | ICD-10-CM

## 2011-09-01 DIAGNOSIS — D0590 Unspecified type of carcinoma in situ of unspecified breast: Secondary | ICD-10-CM

## 2011-09-01 LAB — RESEARCH LABS

## 2011-09-01 NOTE — Progress Notes (Signed)
Patient in to clinic today for radiation therapy. Patient arrived early for collection of last day research samples (blood and urine). Last Day questionnaires were completed by patient in clinic today. Photographs were obtained following daily treatment, per protocol. Skin assessment revealed minimal discomfort with small amount of peeling evident (grade 2). Patient is aware that the next study visit will occur in approximately one month. Thanked patient for her participation in the research study.

## 2011-09-02 ENCOUNTER — Ambulatory Visit
Admission: RE | Admit: 2011-09-02 | Discharge: 2011-09-02 | Disposition: A | Discharge status: Home / Self Care | Payer: 59 | Source: Ambulatory Visit | Attending: Radiation Oncology | Admitting: Radiation Oncology

## 2011-09-02 DIAGNOSIS — C50119 Malignant neoplasm of central portion of unspecified female breast: Secondary | ICD-10-CM

## 2011-09-02 NOTE — Progress Notes (Signed)
Weekly Management Note Current Dose: 61  Gy  Projected Dose: 61 Gy   Narrative:  The patient presents for routine under treatment assessment.  CBCT/MVCT images/Port film x-rays were reviewed.  The chart was checked. Doing well. C/o sore nipple and breast.  Using radiaplex.  Physical Findings: Weight: 189 lb 8 oz (85.957 kg). Skin shows dry desquamation throughout breast. Red in boost field over nipple.  Impression:  The patient is tolerating radiation.  Plan:  Continue treatment as planned.  Continue radiaplex x 2 weeks then lotion with vitamin e. F/u in 1 month. Has script for tam.

## 2011-09-02 NOTE — Progress Notes (Signed)
Completes today.  Reports tenderness left breast.  Bright erythema right breast with hyperpigmentation noted in left axilla and around areola region.  Desquamation resolved in inframmary fold and skin soft and intact.  Does report some fatigue which causes her to fall asleep earlier than usual when she gets home after work.   Will continue her normal 8-5pm work schedule starting next week.

## 2011-09-14 NOTE — Procedures (Signed)
DIAGNOSIS:  Right breast cancer.  NARRATIVE:  Angelica Hogan underwent simulation and treatment planning for her boost on 08/02/2011.  I outlined her tumor cavity using her previous CT scan.  She will receive a total dose of 16 Gy at 2 Gy per fraction x8 fractions.  Fifteen MeV electrons will be prescribed to the 91% isodose line.  A special port plan is requested.  A block will be used for beam modification purposes.    ______________________________ Lurline Hare, M.D. SW/MEDQ  D:  09/15/2011  T:  09/14/2011  Job:  [id number]

## 2011-09-15 NOTE — Progress Notes (Signed)
CC:   Angelica Hogan. Angelica Hogan, M.D. Angelica Hogan, M.D., F.R.C.P.C.  DIAGNOSIS:  Right breast cancer.  TREATMENT DATES:  07/16/2011 to 09/02/2011.  ANATOMIC REGION TREATED: 1. Right breast. 2. Right breast boost.  BEAM ARRANGEMENT: 1. 45 Gy at 1.8 Gy per fraction x25 fractions. 2. 16 Gy at 2 per fraction x8 fractions.  BEAM ARRANGEMENT: 1. Opposed tangents with reduced fields. 2. En phos electrons.  BEAM ENERGY: 1. 6 mV photons. 2. 15 MeV electrons.  TREATMENT TOLERANCE:  Ms. Barhorst has tolerated her treatments well.  She had the expected skin toxicity of some dried desquamation in inframammary folds.  She continued to work throughout the course of her treatment.  FOLLOW UP:  I plan on seeing her back in 1 month's time.  She also has followup scheduled with Dr. Donnie Coffin.    ______________________________ Lurline Hare, M.D. SW/MEDQ  D:  09/15/2011  T:  09/14/2011  Job:  [id number]

## 2011-09-24 ENCOUNTER — Telehealth: Payer: Self-pay | Admitting: *Deleted

## 2011-09-24 NOTE — Telephone Encounter (Signed)
no way to leave a message because the mailbox was full left a call back number so patient can call me back and I can inform her of the new date and time of the new time and date

## 2011-10-02 ENCOUNTER — Encounter: Payer: Self-pay | Admitting: *Deleted

## 2011-10-02 ENCOUNTER — Ambulatory Visit
Admission: RE | Admit: 2011-10-02 | Discharge: 2011-10-02 | Disposition: A | Discharge status: Home / Self Care | Payer: 59 | Source: Ambulatory Visit | Attending: Radiation Oncology | Admitting: Radiation Oncology

## 2011-10-02 NOTE — Progress Notes (Signed)
Pt. Here for 1 mth follow up post radiation to right breast. Skin very dry with mild discoloration.informed to continue to moisturize. Has fatigue which is improving. To start tamoxifen Monday 10/07/2011. Protocol pt. Has survey.will notify [employer] research to take pictures.

## 2011-10-02 NOTE — Progress Notes (Signed)
Patient in to clinic today for routine one month follow up visit in Radiation Oncology. Questionnaires completed by patient upon arrival. Following exam by Dr. Michell Heinrich, photographs were obtained per protocol. Patient reports that she is currently not using any skin products, but she continued to use Biafine for two weeks following the end of RT. She will begin using a skin lotion with vitamin E starting tomorrow. Per Irish Elders, patient was given a routine 6 month follow-up appointment. Patient aware that she will need to be seen at two months from the end of RT, either the week of January 28th or the week of February 4th. Per patient request, will schedule research follow-up appointment for Friday, February 1st at 4pm. Patient is aware that this appointment is for photographs and skin check only.

## 2011-10-06 NOTE — Progress Notes (Signed)
CC:   Angelica Hogan. Ezzard Standing, M.D. Pierce Crane, M.D., F.R.C.P.C.  DIAGNOSIS:  Right breast cancer.  PREVIOUS RADIATION:  To a total dose of 61 Gy, completed 09/02/2011.  INTERVAL SINCE TREATMENT:  1 month.  INTERVAL HISTORY:  Angelica Hogan reports for followup today.  She has done great.  She has really healed up nicely except for some skin thickening and darkening around her nipple.  She says her energy levels have continued to improve, but she is still struggling a bit with fatigue.  PHYSICAL EXAMINATION:  Skin:  Well healed.  She has some darkening in the inframammary fold and around her boost site.  Really a good cosmetic result.  IMPRESSION:  Ductal carcinoma in situ of the right breast status post breast conservation.  RECOMMENDATIONS:  Angelica Hogan looks great.  She is going to start her tamoxifen on Monday.  She met with our clinical nurse to fill out her paperwork there, and I will plan on seeing her back in 6 months' time per protocol.    ______________________________ Lurline Hare, M.D. SW/MEDQ  D:  10/03/2011  T:  10/06/2011  Job:  [id number]

## 2011-10-13 ENCOUNTER — Ambulatory Visit: Payer: 59 | Admitting: Oncology

## 2011-10-13 ENCOUNTER — Other Ambulatory Visit: Payer: 59 | Admitting: Lab

## 2011-10-13 ENCOUNTER — Other Ambulatory Visit: Payer: Self-pay | Admitting: *Deleted

## 2011-10-14 ENCOUNTER — Telehealth: Payer: Self-pay | Admitting: Oncology

## 2011-10-14 NOTE — Telephone Encounter (Signed)
S/w the pt and she is aware of her feb 15th appts.

## 2011-10-16 ENCOUNTER — Ambulatory Visit: Payer: 59 | Admitting: Radiation Oncology

## 2011-10-17 ENCOUNTER — Ambulatory Visit: Payer: 59 | Admitting: Oncology

## 2011-10-17 ENCOUNTER — Other Ambulatory Visit: Payer: 59 | Admitting: Lab

## 2011-10-30 ENCOUNTER — Ambulatory Visit: Payer: 59 | Admitting: *Deleted

## 2011-10-30 DIAGNOSIS — C50119 Malignant neoplasm of central portion of unspecified female breast: Secondary | ICD-10-CM

## 2011-10-30 NOTE — Progress Notes (Signed)
10/30/2011 4:20pm  Patient in to clinic today for Month 2 visit, visit changed from 10/31/2011 to today, per patient request. Photographs were obtained per protocol. Patient reports that she is using Vitamin E lotion on her skin at present. She confirms that she began taking tamoxifen on January 9th. Patient is aware that next study visit will occur in May, at the time of her scheduled follow-up appointment with Dr. Michell Heinrich; that visit will include questionnaires, exam and photographs. Thanked patient for her participation in the study.

## 2011-10-31 ENCOUNTER — Encounter: Payer: 59 | Admitting: *Deleted

## 2011-11-13 ENCOUNTER — Encounter (INDEPENDENT_AMBULATORY_CARE_PROVIDER_SITE_OTHER): Payer: Self-pay | Admitting: Surgery

## 2011-11-14 ENCOUNTER — Other Ambulatory Visit: Payer: 59 | Admitting: Lab

## 2011-11-14 ENCOUNTER — Ambulatory Visit (HOSPITAL_BASED_OUTPATIENT_CLINIC_OR_DEPARTMENT_OTHER): Payer: 59 | Admitting: Oncology

## 2011-11-14 VITALS — BP 119/72 | HR 74 | Temp 98.4°F | Wt 190.8 lb

## 2011-11-14 DIAGNOSIS — C50119 Malignant neoplasm of central portion of unspecified female breast: Secondary | ICD-10-CM

## 2011-11-14 LAB — CBC & DIFF AND RETIC
Basophils Absolute: 0 10*3/uL (ref 0.0–0.1)
EOS%: 1.6 % (ref 0.0–7.0)
HGB: 11.3 g/dL — ABNORMAL LOW (ref 11.6–15.9)
Immature Retic Fract: 11.3 % — ABNORMAL HIGH (ref 1.60–10.00)
MCH: 23.3 pg — ABNORMAL LOW (ref 25.1–34.0)
NEUT#: 1.7 10*3/uL (ref 1.5–6.5)
RDW: 16.6 % — ABNORMAL HIGH (ref 11.2–14.5)
Retic %: 1.31 % (ref 0.70–2.10)
Retic Ct Abs: 63.4 10*3/uL (ref 33.70–90.70)
lymph#: 1.2 10*3/uL (ref 0.9–3.3)

## 2011-11-14 LAB — COMPREHENSIVE METABOLIC PANEL
ALT: 17 U/L (ref 0–35)
AST: 17 U/L (ref 0–37)
Alkaline Phosphatase: 57 U/L (ref 39–117)
Calcium: 8.9 mg/dL (ref 8.4–10.5)
Chloride: 107 mEq/L (ref 96–112)
Creatinine, Ser: 0.74 mg/dL (ref 0.50–1.10)
Total Bilirubin: 0.2 mg/dL — ABNORMAL LOW (ref 0.3–1.2)

## 2011-11-14 LAB — CHCC SMEAR

## 2011-11-16 NOTE — Progress Notes (Signed)
Hematology and Oncology Follow Up Visit  Angelica Hogan [id number] 31-Dec-1960 50 y.o. 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  Allergies: No Known Allergies  Past Medical History, Surgical history, Social history, and Family History were reviewed and updated.  Review of Systems: Constitutional:  Negative for fever, chills, night sweats, anorexia, weight loss, pain. Cardiovascular: negative Respiratory: negative Neurological: negative Dermatological: negative ENT: negative Skin Gastrointestinal: no abdominal pain, change in bowel habits, or black or bloody stools Genito-Urinary: negative Hematological and Lymphatic: negative Breast: negative Musculoskeletal: negative Remaining ROS negative.  Physical Exam: Blood pressure 119/72, pulse 74, temperature 98.4 F (36.9 C), temperature source Oral, weight 190 lb 12.8 oz (86.546 kg), last menstrual period 12/11/2010. ECOG: 0 General appearance: alert, cooperative and appears stated age Head: Normocephalic, without obvious abnormality, atraumatic Neck: no adenopathy, no carotid bruit, no JVD, supple, symmetrical, trachea midline and thyroid not enlarged, symmetric, no tenderness/mass/nodules Lymph nodes: Cervical, supraclavicular, and axillary nodes normal. Cardiac : regular rate and rhythm, no murmurs or gallops Pulmonary:clear to auscultation bilaterally and normal percussion bilaterally Breasts: inspection negative, no nipple discharge or bleeding, no masses or nodularity palpable, rt breast healing well. Abdomen:soft, non-tender; bowel sounds normal; no masses,  no organomegaly Extremities negative Neuro:  alert, oriented, normal speech, no focal findings or movement disorder noted  Lab Results: Lab Results  Component Value Date   WBC 3.2* 11/14/2011   HGB 11.3* 11/14/2011   HCT 34.4* 11/14/2011   MCV 71.1* 11/14/2011   PLT 193 11/14/2011     Chemistry      Component Value Date/Time   NA 139 11/14/2011 1442   K 3.8 11/14/2011 1442   CL 107 11/14/2011 1442   CO2 23 11/14/2011 1442   BUN 12 11/14/2011 1442   CREATININE 0.74 11/14/2011 1442      Component Value Date/Time   CALCIUM 8.9 11/14/2011 1442   ALKPHOS 57 11/14/2011 1442   AST 17 11/14/2011 1442   ALT 17 11/14/2011 1442   BILITOT 0.2* 11/14/2011 1442      .pathology. Radiological Studies: chest X-ray n/a Mammogram n/a Bone density n/a  Impression and Plan: Ms Angelica Hogan is doing well, she will continue tamoxifen and obtain a mammogram before she returns.  More than 50% of the visit was spent in patient-related counselling   Pierce Crane, MD 2/17/20139:48 PM

## 2011-11-17 ENCOUNTER — Telehealth: Payer: Self-pay | Admitting: *Deleted

## 2011-11-17 NOTE — Telephone Encounter (Signed)
made patient appointment with [facility] to have mammogram and bone density done  at the end of July

## 2012-01-20 ENCOUNTER — Telehealth: Payer: Self-pay | Admitting: Radiation Oncology

## 2012-01-20 NOTE — Telephone Encounter (Signed)
Pt has expressed concerns to Dr. Michell Heinrich about her outstanding bills and collections. [facility] billing has outstanding bill bal's, as well as, [facility]. Will f/u with patient via mail with a ltr; epp and mcd appl's to see if pt will qualify for addl financial asst at this time.

## 2012-02-05 ENCOUNTER — Ambulatory Visit: Admission: RE | Admit: 2012-02-05 | Payer: 59 | Source: Ambulatory Visit | Admitting: Radiation Oncology

## 2012-02-05 ENCOUNTER — Telehealth: Payer: Self-pay | Admitting: *Deleted

## 2012-02-05 NOTE — Telephone Encounter (Signed)
Called patient phone [phone] is her cell and home number, mailbox was full unable to leave a message,  Pt had 8am appt f/u 8:18 AM

## 2012-02-12 ENCOUNTER — Ambulatory Visit
Admission: RE | Admit: 2012-02-12 | Discharge: 2012-02-12 | Disposition: A | Discharge status: Home / Self Care | Payer: 59 | Source: Ambulatory Visit | Attending: Radiation Oncology | Admitting: Radiation Oncology

## 2012-02-12 ENCOUNTER — Encounter: Payer: Self-pay | Admitting: *Deleted

## 2012-02-12 VITALS — BP 115/64 | HR 83 | Temp 98.5°F | Wt 192.6 lb

## 2012-02-12 DIAGNOSIS — C50119 Malignant neoplasm of central portion of unspecified female breast: Secondary | ICD-10-CM

## 2012-02-12 NOTE — Progress Notes (Signed)
   Department of Radiation Oncology  Phone:  [phone] Fax:        [phone]   Name: Angelica Hogan   DOB: Dec 22, 1960  MRN: [id number]    Date: 02/12/2012  Follow Up Visit Note  CC: Leonard Downing, RRT  Diagnosis: Right breast cancer  Interval since last radiation: 6 months  Allergies: No Known Allergies  Medications:  Current Outpatient Prescriptions  Medication Sig Dispense Refill  . acetaminophen (TYLENOL) 325 MG tablet Take 650 mg by mouth as needed.        Marland Kitchen alum & mag hydroxide-simeth (MAALOX PLUS) 400-400-40 MG/5ML suspension Take 10 mLs by mouth as needed.        . calcium carbonate (TUMS - DOSED IN MG ELEMENTAL CALCIUM) 500 MG chewable tablet Chew 1 tablet by mouth as needed.        Marland Kitchen oxyCODONE-acetaminophen (TYLOX) 5-500 MG per capsule Take 1 capsule by mouth every 4 (four) hours as needed. 1-2 Q 4-6 hrs prn pain      . ranitidine (ZANTAC) 150 MG tablet Take 150 mg by mouth 2 (two) times daily.        . tamoxifen (NOLVADEX) 20 MG tablet Take 20 mg by mouth daily.          Interval History: Angelica Hogan presents today for routine followup.  She is feeling well and doing well. She is having some vaginal bleeding and saw her GYN yesterday for consideration of hysterectomy for fibroids. He is on tamoxifen. She is scheduled for mammogram and bone density at the end of July. She has filled out her paperwork regarding skin changes for the research study. She has no breast related complaints. She has not had a mammogram since the end of her treatments. Physical Exam:   weight is 192 lb 9.6 oz (87.363 kg). Her temperature is 98.5 F (36.9 C). Her blood pressure is 115/64 and her pulse is 83.  She has a dark and right breast. This is particularly noticeable in the superior and lateral aspect. There is no palpable abnormalities other than her scar tissue. No palpable abnormalities of the left breast. She has no palpable cervical or supra-clavicular lymph  nodes.  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 female who has no evidence of disease. Her paperwork was filled out for the research study. She has followup with Dr. Donnie Coffin in August. She has mammogram scheduled for July. I will see her back in a year.    Lurline Hare, MD

## 2012-02-12 NOTE — Progress Notes (Signed)
Patient in to clinic today for Month 6 visit. Questionnaires completed by patient upon arrival to clinic. Patient has not been diagnosed with a recurrence of breast cancer. She continues to use Vitamin E cream on her skin and is not using any new skin products. She continues to take tamoxifen daily (started in January 2013) and has received no other anti-cancer therapy. RTOG SOMA form to be completed by Dr. Michell Heinrich. Photographs taken per protocol and month 12 visit planned.

## 2012-02-12 NOTE — Progress Notes (Signed)
FU today.  S/P Radiation x 5 months. Her menstraul cycle restarted with what she describes as "severe" abdominal pain.  She has fibroids and is contemplating having a hysterectomy.    Hyperpigmentation noted in right axilla and in the areola region of right breast.  Denies any pain.  States less fatigue.

## 2012-05-01 ENCOUNTER — Emergency Department (HOSPITAL_COMMUNITY)
Admission: EM | Admit: 2012-05-01 | Discharge: 2012-05-01 | Disposition: A | Discharge status: Home / Self Care | Payer: 59 | Source: Home / Self Care | Attending: Emergency Medicine | Admitting: Emergency Medicine

## 2012-05-01 ENCOUNTER — Encounter (HOSPITAL_COMMUNITY): Payer: Self-pay | Admitting: *Deleted

## 2012-05-01 DIAGNOSIS — J020 Streptococcal pharyngitis: Secondary | ICD-10-CM

## 2012-05-01 DIAGNOSIS — J040 Acute laryngitis: Secondary | ICD-10-CM

## 2012-05-01 MED ORDER — PREDNISONE 5 MG PO KIT: 1.0000 | PACK | Freq: Every day | ORAL | Status: DC

## 2012-05-01 MED ORDER — PENICILLIN V POTASSIUM 500 MG PO TABS: 500.0000 mg | ORAL_TABLET | Freq: Four times a day (QID) | ORAL | Status: AC

## 2012-05-01 MED ORDER — BENZONATATE 200 MG PO CAPS: 200.0000 mg | ORAL_CAPSULE | Freq: Three times a day (TID) | ORAL | Status: AC | PRN

## 2012-05-01 NOTE — ED Notes (Signed)
Per pt loss of voice on Wednesday sore throat onset Thursday - cough onset today  - taking otc meds

## 2012-05-01 NOTE — ED Provider Notes (Signed)
Chief Complaint  Patient presents with  . Sore Throat  . Laryngitis    History of Present Illness:   Angelica Hogan is a 51 year old female who has had a four-day history of sore throat, hoarseness, cough which was initially productive of small amounts of yellow sputum but now is dry, tightness in chest, felt feverish, chilled, had rhinorrhea, headache, sinus pressure around her right eye, and aching in her ears. She has had no specific exposures, however she does work with the public at [employer].  Review of Systems:  Other than as noted above, the patient denies any of the following symptoms. Systemic:  No fever, chills, sweats, fatigue, myalgias, headache, or anorexia. Eye:  No redness, pain or drainage. ENT:  No earache, ear congestion, nasal congestion, sneezing, rhinorrhea, sinus pressure, sinus pain, or post nasal drip. Lungs:  No cough, sputum production, wheezing, shortness of breath, or chest pain. GI:  No abdominal pain, nausea, vomiting, or diarrhea. Skin:  No rash or itching.  PMFSH:  Past medical history, family history, social history, meds, allergies, and nurse's notes were reviewed.  There is no known exposure to strep or mono.  No prior history of step or mono.  The patient denies use of tobacco.  Physical Exam:   Vital signs:  BP 135/90  Pulse 82  Temp 99.1 F (37.3 C) (Oral)  Resp 17  SpO2 95%  LMP 04/08/2011 General:  Alert, in no distress. Eye:  No conjunctival injection or drainage. Lids were normal. ENT:  TMs and canals were normal, without erythema or inflammation.  Nasal mucosa was clear and uncongested, without drainage.  Mucous membranes were moist.  Exam of pharynx was unremarkable with no erythema, swelling, or exudate.  There were no oral ulcerations or lesions. Neck:  Supple, no adenopathy, tenderness or mass. Lungs:  No respiratory distress.  Lungs were clear to auscultation, without wheezes, rales or rhonchi.  Breath sounds were clear and equal  bilaterally.  Heart:  Regular rhythm, without gallops, murmers or rubs. Skin:  Clear, warm, and dry, without rash or lesions.  Labs:   Results for orders placed during the hospital encounter of 05/01/12  POCT RAPID STREP A ([facility] ONLY)      Component Value Range   Streptococcus, Group A Screen (Direct) POSITIVE (*) NEGATIVE    Assessment:  The primary encounter diagnosis was Strep throat. A diagnosis of Laryngitis was also pertinent to this visit. I was surprised to find out that her strep test was positive, since clinically she has viral laryngitis, but since we do have a positive test, we'll need to go ahead and treat.  Plan:   1.  The following meds were prescribed:   New Prescriptions   BENZONATATE (TESSALON) 200 MG CAPSULE    Take 1 capsule (200 mg total) by mouth 3 (three) times daily as needed for cough.   PENICILLIN V POTASSIUM (VEETID) 500 MG TABLET    Take 1 tablet (500 mg total) by mouth 4 (four) times daily.   PREDNISONE 5 MG KIT    Take 1 kit (5 mg total) by mouth daily after breakfast. Prednisone 5 mg 6 day dosepack.  Take as directed.   2.  The patient was instructed in symptomatic care including hot saline gargles, throat lozenges, infectious precautions, and need to trade out toothbrush. Handouts were given. 3.  The patient was told to return if becoming worse in any way, if no better in 3 or 4 days, and given some  red flag symptoms that would indicate earlier return.    Reuben Likes, MD 05/01/12 [phone]

## 2012-05-18 ENCOUNTER — Ambulatory Visit: Payer: 59 | Admitting: Family

## 2012-05-18 ENCOUNTER — Telehealth: Payer: Self-pay | Admitting: *Deleted

## 2012-05-18 ENCOUNTER — Other Ambulatory Visit: Payer: 59

## 2012-05-18 NOTE — Progress Notes (Signed)
No show. Will send POF to reschedule.  

## 2012-05-18 NOTE — Telephone Encounter (Signed)
Called the patient informed the patient about the 06-2012

## 2012-07-22 ENCOUNTER — Ambulatory Visit: Payer: 59 | Admitting: Oncology

## 2012-07-22 ENCOUNTER — Other Ambulatory Visit: Payer: 59 | Admitting: Lab

## 2012-08-11 ENCOUNTER — Other Ambulatory Visit: Payer: Self-pay | Admitting: *Deleted

## 2012-08-11 DIAGNOSIS — C50119 Malignant neoplasm of central portion of unspecified female breast: Secondary | ICD-10-CM

## 2012-08-12 ENCOUNTER — Telehealth: Payer: Self-pay | Admitting: Oncology

## 2012-08-12 NOTE — Telephone Encounter (Signed)
S/w the pt and she is aware of her nov appts. °

## 2012-08-16 ENCOUNTER — Other Ambulatory Visit: Payer: 59 | Admitting: Lab

## 2012-08-16 ENCOUNTER — Ambulatory Visit: Payer: 59 | Admitting: Oncology

## 2012-08-18 ENCOUNTER — Telehealth: Payer: Self-pay | Admitting: *Deleted

## 2012-08-18 NOTE — Telephone Encounter (Addendum)
08/18/2012 10:10am Left voice mail message for patient at her work extension [phone] to confirm her appointment for tomorrow afternoon with Dr. Michell Heinrich at 2:20pm.

## 2012-08-19 ENCOUNTER — Ambulatory Visit: Payer: 59 | Admitting: Radiation Oncology

## 2012-08-19 ENCOUNTER — Ambulatory Visit
Admission: RE | Admit: 2012-08-19 | Discharge: 2012-08-19 | Disposition: A | Discharge status: Home / Self Care | Payer: 59 | Source: Ambulatory Visit | Attending: Radiation Oncology | Admitting: Radiation Oncology

## 2012-08-19 ENCOUNTER — Encounter: Payer: Self-pay | Admitting: *Deleted

## 2012-08-19 ENCOUNTER — Other Ambulatory Visit: Payer: 59 | Admitting: Lab

## 2012-08-19 ENCOUNTER — Ambulatory Visit: Payer: 59 | Admitting: Oncology

## 2012-08-19 VITALS — BP 137/85 | HR 80 | Temp 97.8°F | Wt 193.3 lb

## 2012-08-19 DIAGNOSIS — C50119 Malignant neoplasm of central portion of unspecified female breast: Secondary | ICD-10-CM

## 2012-08-19 NOTE — Progress Notes (Signed)
Patient here for routine follow up.Hasn't had mammogram yet.Doing well.No health problems.Taking tamoxifen with out side effects.

## 2012-08-19 NOTE — Progress Notes (Signed)
Patient in to clinic today for Month 12 visit. Questionnaires completed by patient upon arrival to clinic. Patient has not been diagnosed with a recurrence of breast cancer. She has stopped using Vitamin E cream on her skin; in addition, she is not using any skin products at present and has not used any other skin products since the Month 6 visit. She continues to take tamoxifen daily (started in January 2013) and has received no other anti-cancer therapy. RTOG SOMA form to be completed by Dr. Michell Heinrich. Photographs taken per protocol. Thanked patient for her participation, noting that this concludes her participation in the study.

## 2012-08-19 NOTE — Progress Notes (Signed)
   Department of Radiation Oncology  Phone:  [phone] Fax:        [phone]   Name: GALENA LOGIE   DOB: 1961-03-26  MRN: [id number]    Date: 08/19/2012  Follow Up Visit Note  Diagnosis: Right breast cancer  Interval since last radiation: One year  Interval History: Brenlynn presents today for routine followup.  She is doing well and feeling well. She is tolerating her tamoxifen well. She needs to have her bilateral mammograms performed. She has a hard time getting off work to come for medical appointments. She has followup with Dr. Donnie Coffin December 9. I have put in orders to have her bilateral mammograms before that time. He has no breast related complaints.  Allergies: No Known Allergies  Medications:  Current Outpatient Prescriptions  Medication Sig Dispense Refill  . acetaminophen (TYLENOL) 325 MG tablet Take 650 mg by mouth as needed.        Marland Kitchen alum & mag hydroxide-simeth (MAALOX PLUS) 400-400-40 MG/5ML suspension Take 10 mLs by mouth as needed.        . calcium carbonate (TUMS - DOSED IN MG ELEMENTAL CALCIUM) 500 MG chewable tablet Chew 1 tablet by mouth as needed.        . Probiotic Product (PROBIOTIC DAILY PO) Take by mouth.      . tamoxifen (NOLVADEX) 20 MG tablet Take 20 mg by mouth daily.        Physical Exam:   weight is 193 lb 4.8 oz (87.68 kg). Her temperature is 97.8 F (36.6 C). Her blood pressure is 137/85 and her pulse is 80.  She is a pleasant female in no distress sitting comfortably examining table. She has a slightly dark and right breast as compared to the left. This is noticeable the inframammary fold and the lateral aspect of the breast. There is a palpable scar tissue around her scar in the superior aspect of the breast. There is a nodule in particular that seems to be below the scar and between her scar in her area left. His moves with gentle palpation.  IMPRESSION: Baneza is a 51 y.o. female status post breast conservation with a good cosmetic  result and no evidence of recurrent disease  PLAN:  Alusio is scheduled for mammograms as soon as possible. I have not scheduled followup with her. She is really scheduled followup Dr. Donnie Coffin. She knows she can always contact me if any questions or concerns.    Lurline Hare, MD

## 2012-08-20 ENCOUNTER — Ambulatory Visit: Payer: 59 | Admitting: Oncology

## 2012-09-06 ENCOUNTER — Other Ambulatory Visit: Payer: 59 | Admitting: Lab

## 2012-09-06 ENCOUNTER — Ambulatory Visit: Payer: 59 | Admitting: Oncology

## 2012-11-01 ENCOUNTER — Encounter (HOSPITAL_COMMUNITY): Payer: Self-pay | Admitting: Emergency Medicine

## 2012-11-01 ENCOUNTER — Emergency Department (HOSPITAL_COMMUNITY)
Admission: EM | Admit: 2012-11-01 | Discharge: 2012-11-01 | Disposition: A | Discharge status: Home / Self Care | Payer: 59 | Source: Home / Self Care | Attending: Family Medicine | Admitting: Family Medicine

## 2012-11-01 DIAGNOSIS — J069 Acute upper respiratory infection, unspecified: Secondary | ICD-10-CM

## 2012-11-01 NOTE — ED Notes (Signed)
Pt is here for sore throat since yest Started out sore but now it's "burning" Sx include: cough, fever, loss of appetite Denies: n/d Took 2 penicillin 500mg  that she had left over from last year  She is alert w/no signs of acute distress.

## 2012-11-01 NOTE — ED Provider Notes (Signed)
History     CSN: [id number]  Arrival date & time 11/01/12  1736   First MD Initiated Contact with Patient 11/01/12 1745      Chief Complaint  Patient presents with  . Sore Throat    (Consider location/radiation/quality/duration/timing/severity/associated sxs/prior treatment) Patient is a 52 y.o. female presenting with pharyngitis. The history is provided by the patient.  Sore Throat This is a new problem. The current episode started yesterday. The problem has not changed since onset.The symptoms are aggravated by swallowing.    Past Medical History  Diagnosis Date  . Fibroids   . 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 Breast cancer     DCIS  . Malignant neoplasm of central portion of female breast 08/18/2011    Past Surgical History  Procedure Date  . Hernia repair 2010  . Nasal sinus surgery     1980's  . Breast lumpectomy 06/12/2011    rt breast    Family History  Problem Relation Age of Onset  . Breast cancer Paternal Aunt   . Cancer Paternal Aunt   . Breast cancer Paternal Grandmother   . Breast cancer Paternal Aunt   . Breast cancer Cousin   . Cancer Cousin   . Breast cancer Cousin   . Cancer Maternal Grandmother     History  Substance Use Topics  . Smoking status: Never Smoker   . Smokeless tobacco: Not on file  . Alcohol Use: No    OB History    Grav Para Term Preterm Abortions TAB SAB Ect Mult Living                  Review of Systems  Constitutional: Negative.  Negative for fever and chills.  HENT: Positive for sore throat. Negative for congestion, rhinorrhea and postnasal drip.     Allergies  Review of patient's allergies indicates no known allergies.  Home Medications   Current Outpatient Rx  Name  Route  Sig  Dispense  Refill  . PROBIOTIC DAILY PO   Oral   Take by mouth.         . TAMOXIFEN CITRATE 20 MG PO TABS   Oral   Take 20 mg by mouth daily.         . ACETAMINOPHEN 325 MG PO TABS   Oral   Take 650  mg by mouth as needed.           Marland Kitchen ALUM & MAG HYDROXIDE-SIMETH 400-400-40 MG/5ML PO SUSP   Oral   Take 10 mLs by mouth as needed.           Marland Kitchen CALCIUM CARBONATE ANTACID 500 MG PO CHEW   Oral   Chew 1 tablet by mouth as needed.             BP 142/90  Pulse 78  Temp 98.3 F (36.8 C) (Oral)  Resp 20  SpO2 98%  LMP 10/23/2012  Physical Exam  Vitals reviewed. Constitutional: She is oriented to person, place, and time. She appears well-developed and well-nourished.  HENT:  Head: Normocephalic.  Right Ear: External ear normal.  Left Ear: External ear normal.  Mouth/Throat: Oropharynx is clear and moist.  Eyes: Pupils are equal, round, and reactive to light.  Neck: Normal range of motion. Neck supple.  Lymphadenopathy:    She has no cervical adenopathy.  Neurological: She is alert and oriented to person, place, and time.  Skin: Skin is warm and dry.  ED Course  Procedures (including critical care time)   Labs Reviewed  POCT RAPID STREP A ([facility] ONLY)  LAB REPORT - SCANNED   No results found.   1. URI (upper respiratory infection)       MDM  Strep --neg.        Linna Hoff, MD 11/02/12 1336

## 2013-02-10 ENCOUNTER — Ambulatory Visit: Payer: 59 | Admitting: Radiation Oncology

## 2013-04-25 ENCOUNTER — Telehealth: Payer: Self-pay | Admitting: *Deleted

## 2013-04-25 NOTE — Telephone Encounter (Signed)
Left message for a return phone call to schedule an appt. With a new provider. Awaiting patient response.

## 2013-04-29 ENCOUNTER — Encounter: Payer: Self-pay | Admitting: *Deleted

## 2013-04-29 NOTE — Progress Notes (Signed)
Mailed letter to request pt to call and schedule appt for new provider

## 2013-06-12 IMAGING — CR DG CHEST 2V
2 series · 2 of 2 positions shown · non-contrast
Comparison: None.

CLINICAL DATA: Preop for breast surgery

CHEST - 2 VIEW

[w chest pa]
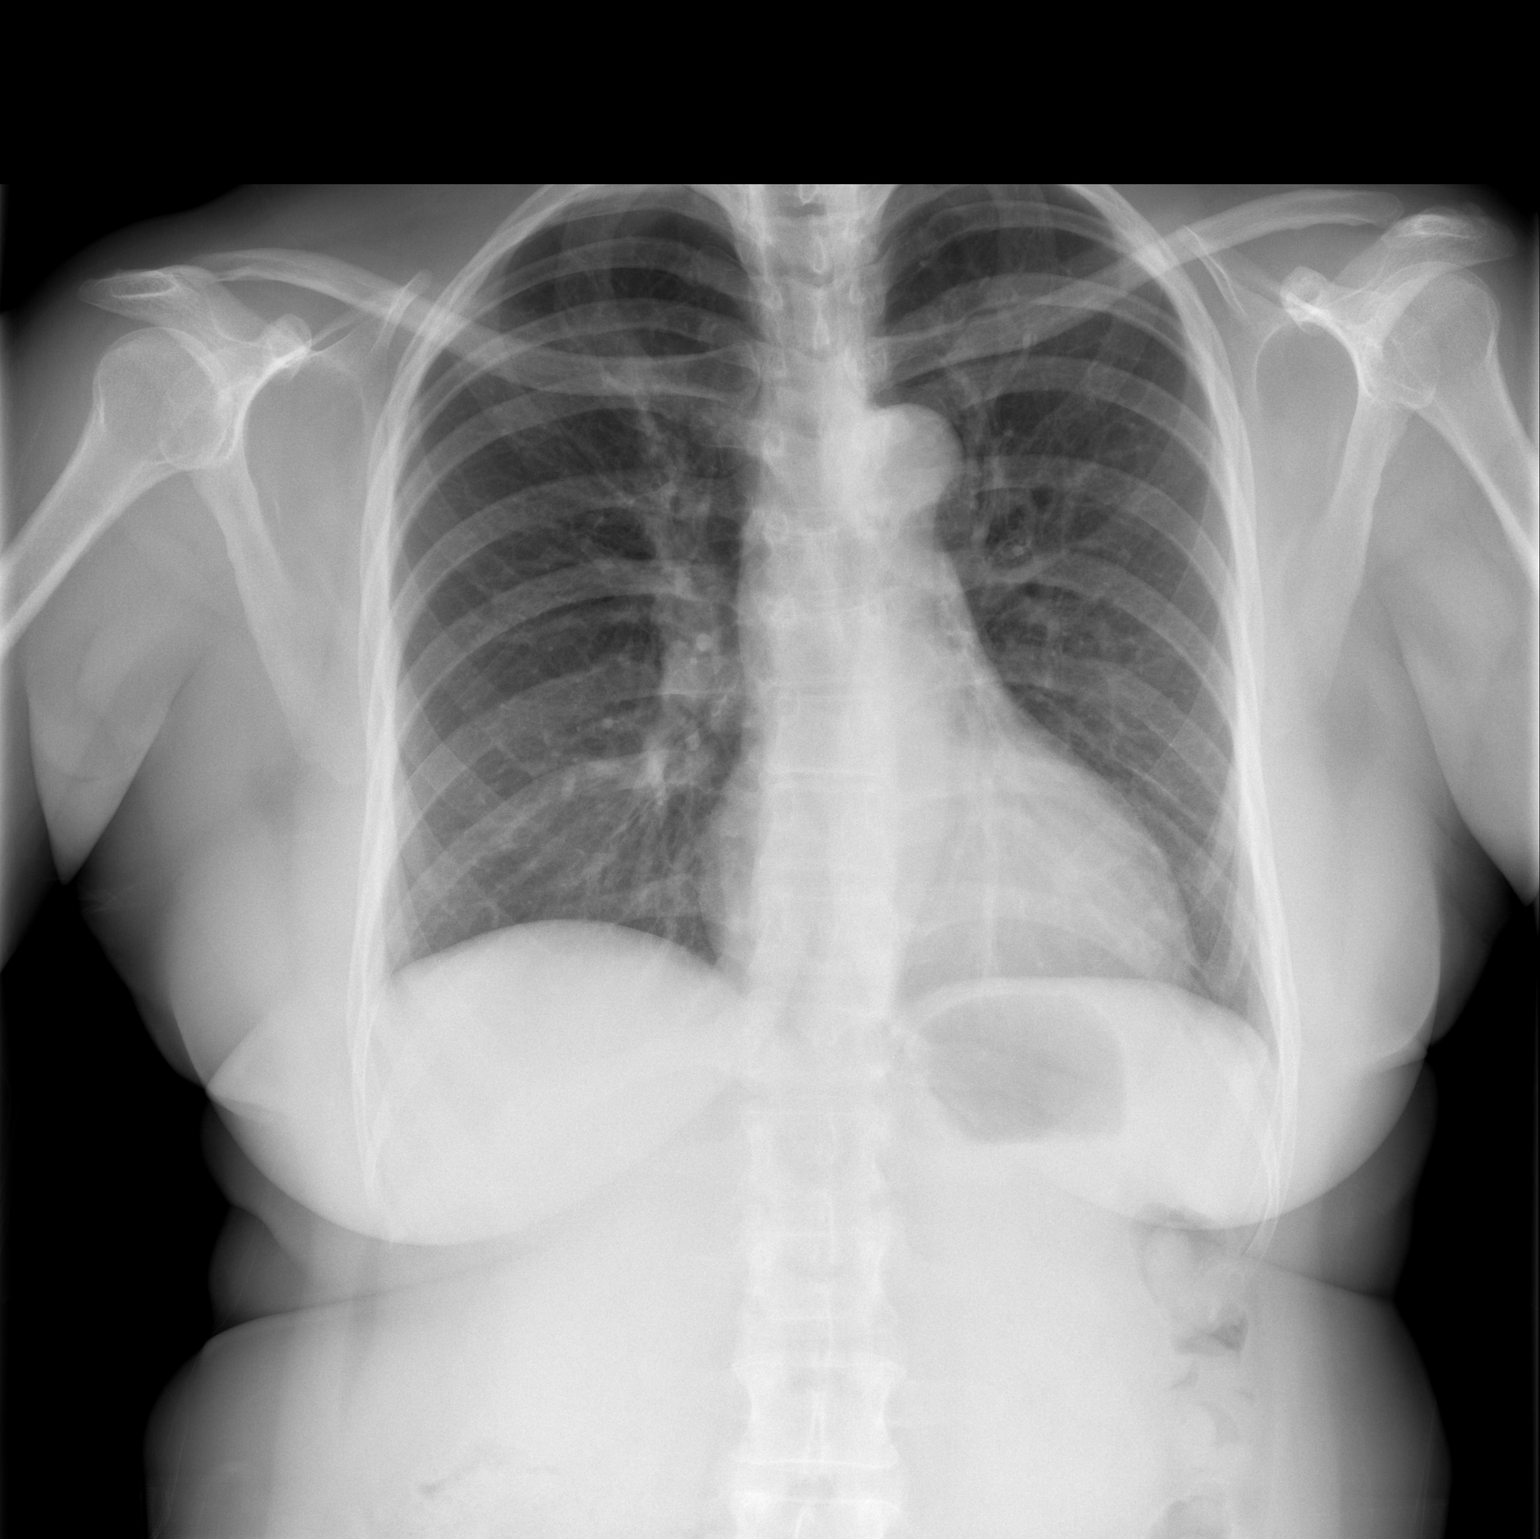

[w chest lat]
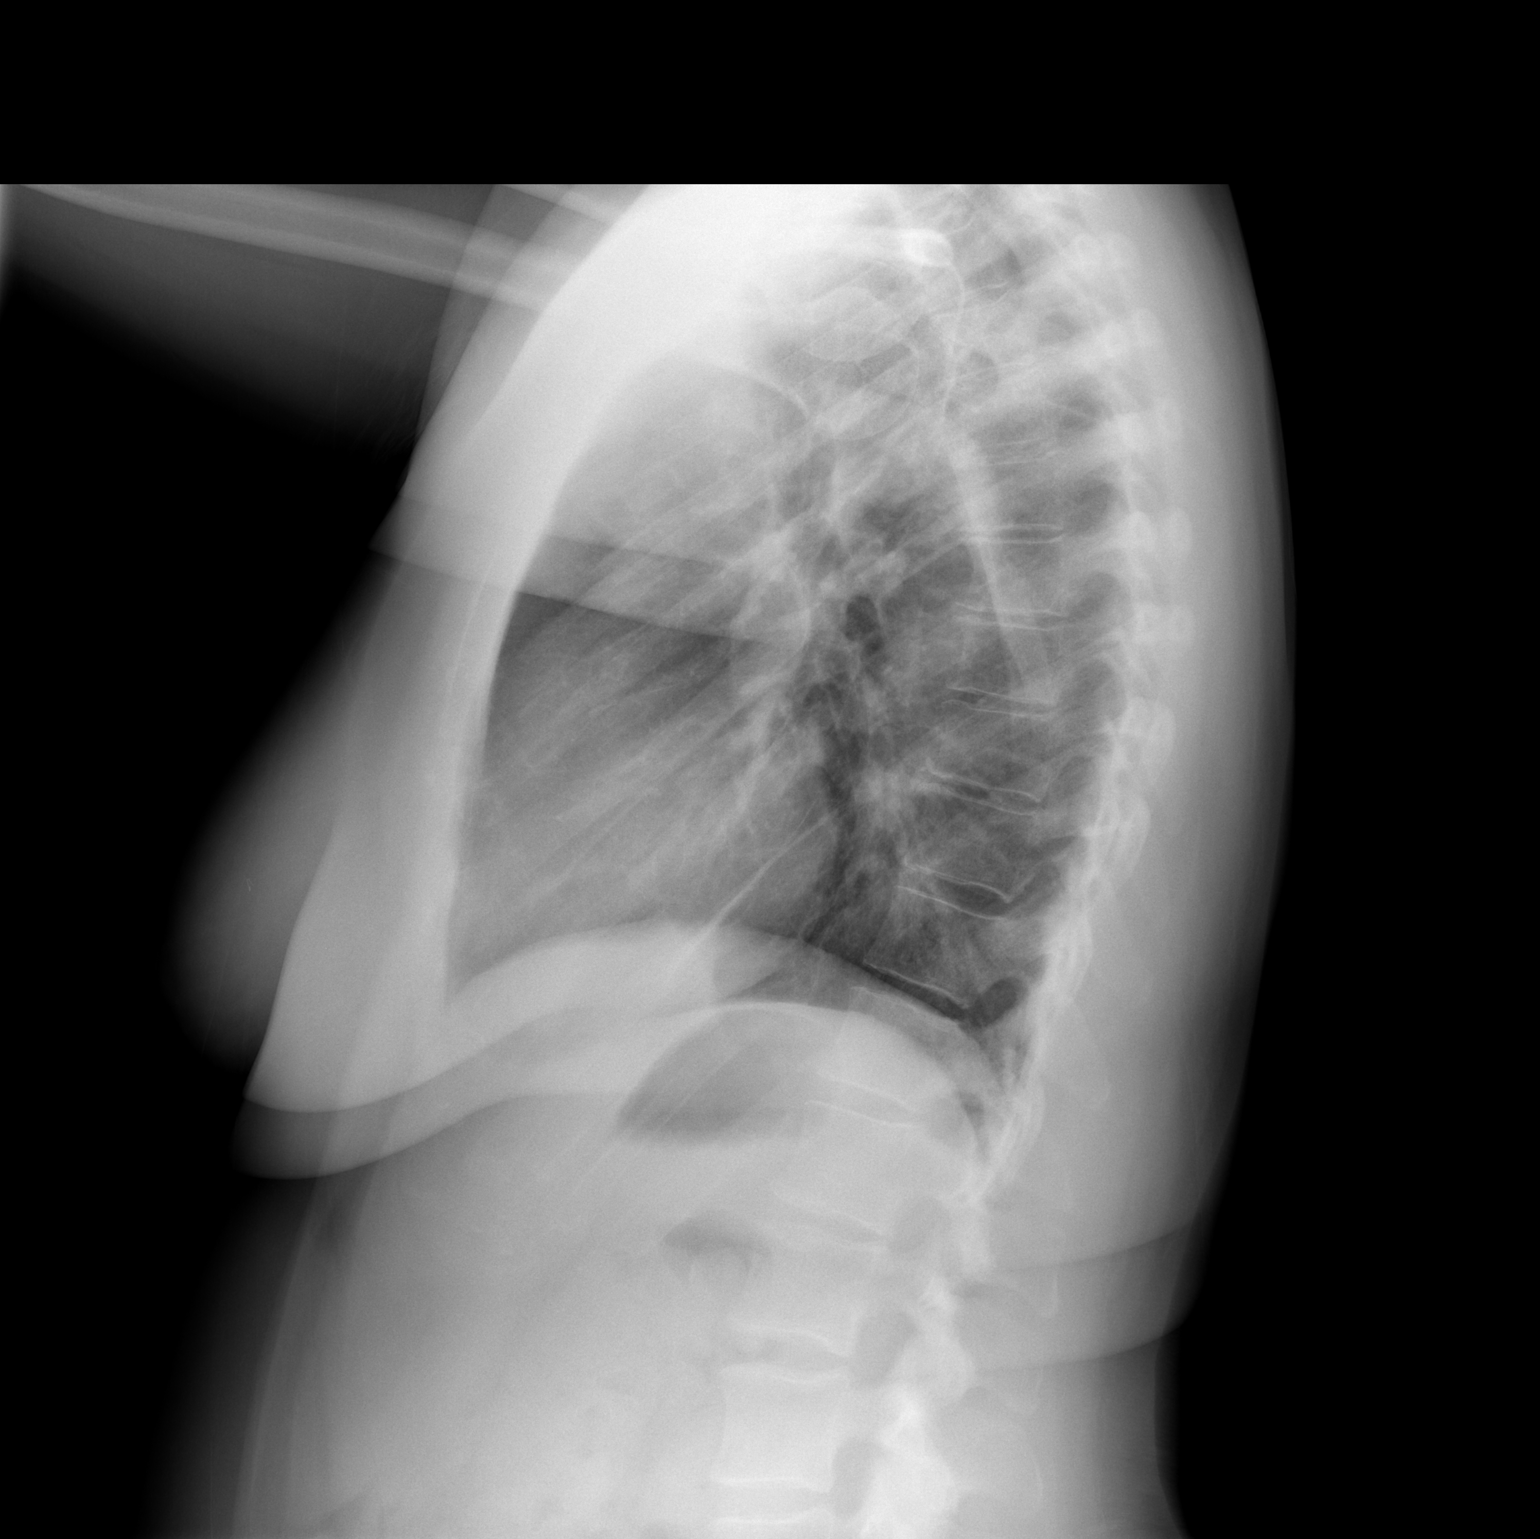

[2 of 2 positions shown; findings below may reference images not displayed]

FINDINGS: The lungs are clear.  Mediastinal contours appear normal.
The heart is within normal limits in size.  No bony abnormality is
seen.
IMPRESSION: No active lung disease.

## 2013-10-25 ENCOUNTER — Other Ambulatory Visit: Payer: Self-pay | Admitting: Dermatology

## 2015-04-10 ENCOUNTER — Encounter: Payer: Self-pay | Admitting: Genetic Counselor

## 2015-07-02 ENCOUNTER — Encounter (HOSPITAL_COMMUNITY): Payer: Self-pay | Admitting: Emergency Medicine

## 2015-07-02 ENCOUNTER — Emergency Department (HOSPITAL_COMMUNITY)
Admission: EM | Admit: 2015-07-02 | Discharge: 2015-07-02 | Disposition: A | Discharge status: Home / Self Care | Payer: 59 | Source: Home / Self Care | Attending: Family Medicine | Admitting: Family Medicine

## 2015-07-02 DIAGNOSIS — J069 Acute upper respiratory infection, unspecified: Secondary | ICD-10-CM | POA: Diagnosis not present

## 2015-07-02 MED ORDER — IPRATROPIUM BROMIDE 0.06 % NA SOLN: 2.0000 | Freq: Four times a day (QID) | NASAL | Status: DC

## 2015-07-02 NOTE — ED Notes (Signed)
9/29 was the onset of symptoms:bilateral ear pain, chest tight, sniffles, cough, and sinus drainage in throat and low grade fever.

## 2015-07-02 NOTE — Discharge Instructions (Signed)
Upper Respiratory Infection, Adult For congestion may use Sudafed PE 10 mg every 4 hours as needed For drainage may use either Zyrtec or Allegra as nondrowsy approach. At nighttime may use another any histamines such is Chlor-Trimeton if necessary. Robitussin-DM as needed for cough Cepacol lozenges for sore throat. Ibuprofen 600 mg every 6 hours as needed for discomfort and sore throat Saline nasal spray frequently and may use Netty pot is often as needed. An upper respiratory infection (URI) is also sometimes known as the common cold. The upper respiratory tract includes the nose, sinuses, throat, trachea, and bronchi. Bronchi are the airways leading to the lungs. Most people improve within 1 week, but symptoms can last up to 2 weeks. A residual cough may last even longer.  CAUSES Many different viruses can infect the tissues lining the upper respiratory tract. The tissues become irritated and inflamed and often become very moist. Mucus production is also common. A cold is contagious. You can easily spread the virus to others by oral contact. This includes kissing, sharing a glass, coughing, or sneezing. Touching your mouth or nose and then touching a surface, which is then touched by another person, can also spread the virus. SYMPTOMS  Symptoms typically develop 1 to 3 days after you come in contact with a cold virus. Symptoms vary from person to person. They may include:  Runny nose.  Sneezing.  Nasal congestion.  Sinus irritation.  Sore throat.  Loss of voice (laryngitis).  Cough.  Fatigue.  Muscle aches.  Loss of appetite.  Headache.  Low-grade fever. DIAGNOSIS  You might diagnose your own cold based on familiar symptoms, since most people get a cold 2 to 3 times a year. Your caregiver can confirm this based on your exam. Most importantly, your caregiver can check that your symptoms are not due to another disease such as strep throat, sinusitis, pneumonia, asthma, or  epiglottitis. Blood tests, throat tests, and X-rays are not necessary to diagnose a common cold, but they may sometimes be helpful in excluding other more serious diseases. Your caregiver will decide if any further tests are required. RISKS AND COMPLICATIONS  You may be at risk for a more severe case of the common cold if you smoke cigarettes, have chronic heart disease (such as heart failure) or lung disease (such as asthma), or if you have a weakened immune system. The very young and very old are also at risk for more serious infections. Bacterial sinusitis, middle ear infections, and bacterial pneumonia can complicate the common cold. The common cold can worsen asthma and chronic obstructive pulmonary disease (COPD). Sometimes, these complications can require emergency medical care and may be life-threatening. PREVENTION  The best way to protect against getting a cold is to practice good hygiene. Avoid oral or hand contact with people with cold symptoms. Wash your hands often if contact occurs. There is no clear evidence that vitamin C, vitamin E, echinacea, or exercise reduces the chance of developing a cold. However, it is always recommended to get plenty of rest and practice good nutrition. TREATMENT  Treatment is directed at relieving symptoms. There is no cure. Antibiotics are not effective, because the infection is caused by a virus, not by bacteria. Treatment may include:  Increased fluid intake. Sports drinks offer valuable electrolytes, sugars, and fluids.  Breathing heated mist or steam (vaporizer or shower).  Eating chicken soup or other clear broths, and maintaining good nutrition.  Getting plenty of rest.  Using gargles or lozenges for comfort.  Controlling fevers with ibuprofen or acetaminophen as directed by your caregiver.  Increasing usage of your inhaler if you have asthma. Zinc gel and zinc lozenges, taken in the first 24 hours of the common cold, can shorten the duration  and lessen the severity of symptoms. Pain medicines may help with fever, muscle aches, and throat pain. A variety of non-prescription medicines are available to treat congestion and runny nose. Your caregiver can make recommendations and may suggest nasal or lung inhalers for other symptoms.  HOME CARE INSTRUCTIONS   Only take over-the-counter or prescription medicines for pain, discomfort, or fever as directed by your caregiver.  Use a warm mist humidifier or inhale steam from a shower to increase air moisture. This may keep secretions moist and make it easier to breathe.  Drink enough water and fluids to keep your urine clear or pale yellow.  Rest as needed.  Return to work when your temperature has returned to normal or as your caregiver advises. You may need to stay home longer to avoid infecting others. You can also use a face mask and careful hand washing to prevent spread of the virus. SEEK MEDICAL CARE IF:   After the first few days, you feel you are getting worse rather than better.  You need your caregiver's advice about medicines to control symptoms.  You develop chills, worsening shortness of breath, or brown or red sputum. These may be signs of pneumonia.  You develop yellow or brown nasal discharge or pain in the face, especially when you bend forward. These may be signs of sinusitis.  You develop a fever, swollen neck glands, pain with swallowing, or white areas in the back of your throat. These may be signs of strep throat. SEEK IMMEDIATE MEDICAL CARE IF:   You have a fever.  You develop severe or persistent headache, ear pain, sinus pain, or chest pain.  You develop wheezing, a prolonged cough, cough up blood, or have a change in your usual mucus (if you have chronic lung disease).  You develop sore muscles or a stiff neck. Document Released: 03/11/2001 Document Revised: 12/08/2011 Document Reviewed: 12/21/2013 [facility] Patient Information 2015 [place], [place]. This  information is not intended to replace advice given to you by your health care provider. Make sure you discuss any questions you have with your health care provider.

## 2015-07-02 NOTE — ED Provider Notes (Signed)
CSN: [id number]     Arrival date & time 07/02/15  1603 History   First MD Initiated Contact with Patient 07/02/15 1734     Chief Complaint  Patient presents with  . URI   (Consider location/radiation/quality/duration/timing/severity/associated sxs/prior Treatment) Patient is a 54 y.o. female presenting with URI. The history is provided by the patient.  URI Presenting symptoms: congestion, cough, ear pain, rhinorrhea and sore throat   Presenting symptoms: no facial pain, no fatigue and no fever   Severity:  Moderate Onset quality:  Sudden Duration:  4 days Timing:  Constant Progression:  Unchanged Chronicity:  New Relieved by:  Nothing Ineffective treatments:  OTC medications Associated symptoms: sneezing   Associated symptoms: no arthralgias, no myalgias, no neck pain, no swollen glands and no wheezing     Past Medical History  Diagnosis Date  . Fibroids   . 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 Breast cancer ([place])     DCIS  . Malignant neoplasm of central portion of female breast ([place]) 08/18/2011   Past Surgical History  Procedure Laterality Date  . Hernia repair  2010  . Nasal sinus surgery      1980's  . Breast lumpectomy  06/12/2011    rt breast   Family History  Problem Relation Age of Onset  . Breast cancer Paternal Aunt   . Cancer Paternal Aunt   . Breast cancer Paternal Grandmother   . Breast cancer Paternal Aunt   . Breast cancer Cousin   . Cancer Cousin   . Breast cancer Cousin   . Cancer Maternal Grandmother    Social History  Substance Use Topics  . Smoking status: Never Smoker   . Smokeless tobacco: None  . Alcohol Use: No   OB History    No data available     Review of Systems  Constitutional: Negative for fever, chills, activity change, appetite change and fatigue.  HENT: Positive for congestion, ear pain, postnasal drip, rhinorrhea, sneezing and sore throat. Negative for ear discharge and facial swelling.   Eyes:  Negative.   Respiratory: Positive for cough. Negative for choking, shortness of breath and wheezing.   Cardiovascular: Negative.   Gastrointestinal: Negative.   Musculoskeletal: Negative for myalgias, arthralgias, neck pain and neck stiffness.  Skin: Negative for pallor and rash.  Neurological: Negative.     Allergies  Review of patient's allergies indicates no known allergies.  Home Medications   Prior to Admission medications   Medication Sig Start Date End Date Taking? Authorizing Provider  acetaminophen (TYLENOL) 325 MG tablet Take 650 mg by mouth every 6 (six) hours as needed.   Yes Historical Provider, MD  acetaminophen (TYLENOL) 325 MG tablet Take 650 mg by mouth as needed.      Historical Provider, MD  alum & mag hydroxide-simeth (MAALOX PLUS) 400-400-40 MG/5ML suspension Take 10 mLs by mouth as needed.      Historical Provider, MD  ipratropium (ATROVENT) 0.06 % nasal spray Place 2 sprays into both nostrils 4 (four) times daily. 07/02/15   Janne Napoleon, NP  Probiotic Product (PROBIOTIC DAILY PO) Take by mouth.    Historical Provider, MD  tamoxifen (NOLVADEX) 20 MG tablet Take 20 mg by mouth daily. 10/08/11   Historical Provider, MD   Meds Ordered and Administered this Visit  Medications - No data to display  BP 166/109 mmHg  Pulse 85  Temp(Src) 99.1 F (37.3 C) (Oral)  Resp 16  SpO2 97% No data found.  Physical Exam  Constitutional: She is oriented to person, place, and time. She appears well-developed and well-nourished. No distress.  HENT:  Bilateral TMs are mildly retracted. No erythema or effusion Oropharynx with minor erythema/injection. No swelling. No exudates. Clear PND.  Eyes: Conjunctivae and EOM are normal.  Neck: Normal range of motion. Neck supple.  Cardiovascular: Normal rate, regular rhythm, normal heart sounds and intact distal pulses.   Pulmonary/Chest: Effort normal and breath sounds normal. No respiratory distress. She has no wheezes. She has no  rales.  Musculoskeletal: She exhibits no edema.  Lymphadenopathy:    She has no cervical adenopathy.  Neurological: She is alert and oriented to person, place, and time. No cranial nerve deficit. She exhibits normal muscle tone.  Skin: Skin is warm and dry. No erythema.  Psychiatric: She has a normal mood and affect. Her behavior is normal.  Nursing note and vitals reviewed.   ED Course  Procedures (including critical care time)  Labs Review Labs Reviewed - No data to display  Imaging Review No results found.   Visual Acuity Review  Right Eye Distance:   Left Eye Distance:   Bilateral Distance:    Right Eye Near:   Left Eye Near:    Bilateral Near:         MDM   1. URI (upper respiratory infection)    Upper Respiratory Infection, Adult For congestion may use Sudafed PE 10 mg every 4 hours as needed For drainage may use either Zyrtec or Allegra as nondrowsy approach. At nighttime may use another any histamines such is Chlor-Trimeton if necessary. Robitussin-DM as needed for cough Cepacol lozenges for sore throat. Ibuprofen 600 mg every 6 hours as needed for discomfort and sore throat Saline nasal spray frequently and may use Netty pot is often as needed.    Janne Napoleon, NP 07/02/15 [phone]

## 2015-07-29 ENCOUNTER — Ambulatory Visit (INDEPENDENT_AMBULATORY_CARE_PROVIDER_SITE_OTHER): Payer: 59 | Admitting: Internal Medicine

## 2015-07-29 VITALS — BP 132/86 | HR 110 | Temp 100.1°F | Resp 16 | Ht 65.0 in | Wt 214.0 lb

## 2015-07-29 DIAGNOSIS — K047 Periapical abscess without sinus: Secondary | ICD-10-CM

## 2015-07-29 LAB — POCT CBC
Granulocyte percent: 72.8 %G (ref 37–80)
HCT, POC: 39 % (ref 37.7–47.9)
HEMOGLOBIN: 12.9 g/dL (ref 12.2–16.2)
LYMPH, POC: 1.8 (ref 0.6–3.4)
MCH, POC: 24.5 pg — AB (ref 27–31.2)
MCHC: 33.1 g/dL (ref 31.8–35.4)
MCV: 74 fL — AB (ref 80–97)
MID (cbc): 0.3 (ref 0–0.9)
MPV: 7.9 fL (ref 0–99.8)
PLATELET COUNT, POC: 204 10*3/uL (ref 142–424)
POC GRANULOCYTE: 5.8 (ref 2–6.9)
POC LYMPH %: 22.9 % (ref 10–50)
POC MID %: 4.3 %M (ref 0–12)
RBC: 5.28 M/uL (ref 4.04–5.48)
RDW, POC: 15.4 %
WBC: 8 10*3/uL (ref 4.6–10.2)

## 2015-07-29 MED ORDER — AMOXICILLIN-POT CLAVULANATE 875-125 MG PO TABS: 1.0000 | ORAL_TABLET | Freq: Two times a day (BID) | ORAL | Status: DC

## 2015-07-29 MED ORDER — CEFTRIAXONE SODIUM 1 G IJ SOLR
1.0000 g | Freq: Once | INTRAMUSCULAR | Status: AC
  Administered 2015-07-29: 1 g via INTRAMUSCULAR

## 2015-07-29 MED ORDER — HYDROCODONE-ACETAMINOPHEN 5-325 MG PO TABS: 1.0000 | ORAL_TABLET | Freq: Four times a day (QID) | ORAL | Status: DC | PRN

## 2015-07-29 NOTE — Progress Notes (Signed)
Subjective:  This chart was scribed for Tami Lin, MD by Thea Alken, ED Scribe. This patient was seen in room 10 and the patient's care was started at 9:41 AM.  Patient ID: Angelica Hogan, female    DOB: 12-21-1960, 54 y.o.   MRN: [id number]  HPI   Chief Complaint  Patient presents with  . Dental Pain    Left side mouth pain began Friday, facial swelling this AM   HPI Comments: Angelica Hogan is a 54 y.o. female who presents to the [facility] complaining of a possible left sided dental abscess. Symptoms started with gradually worsening left upper dental pain that started 2 days ago. She's had fever x 2 days Pt woke up this morning facial pian, left sided facial swelling and pain with biting. No recent dental work. She does not have a dentist. No drug allergies.   Past Medical History  Diagnosis Date  . Fibroids   . 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 Breast cancer ([place])     DCIS  . Malignant neoplasm of central portion of female breast ([place]) 08/18/2011   Prior to Admission medications   Medication Sig Start Date End Date Taking? Authorizing Provider  acetaminophen (TYLENOL) 325 MG tablet Take 650 mg by mouth as needed.     Yes Historical Provider, MD  Probiotic Product (PROBIOTIC DAILY PO) Take by mouth.   Yes Historical Provider, MD   Review of Systems  Constitutional: Positive for fever.  HENT: Positive for dental problem and facial swelling.    Objective:   Physical Exam  Constitutional: She is oriented to person, place, and time. She appears well-developed and well-nourished. No distress.  HENT:  Head: Normocephalic and atraumatic.  Left upper 1st molar completely destroyed by decay to gum line with dental abscess extending posteriorly at that point.  There is moderate swelling left malar without redness but marked tenderness.   Eyes: Conjunctivae and EOM are normal.  Neck: Neck supple.  Cardiovascular: Normal rate.     Pulmonary/Chest: Effort normal.  Musculoskeletal: Normal range of motion.  Neurological: She is alert and oriented to person, place, and time.  Skin: Skin is warm and dry.  Psychiatric: She has a normal mood and affect. Her behavior is normal.  Nursing note and vitals reviewed.  Filed Vitals:   07/29/15 0846  BP: 132/86  Pulse: 110  Temp: 100.1 F (37.8 C)  TempSrc: Oral  Resp: 16  Height: 5\' 5"  (1.651 m)  Weight: 214 lb (97.07 kg)  SpO2: 98%    Results for orders placed or performed in visit on 07/29/15  POCT CBC  Result Value Ref Range   WBC 8.0 4.6 - 10.2 K/uL   Lymph, poc 1.8 0.6 - 3.4   POC LYMPH PERCENT 22.9 10 - 50 %L   MID (cbc) 0.3 0 - 0.9   POC MID % 4.3 0 - 12 %M   POC Granulocyte 5.8 2 - 6.9   Granulocyte percent 72.8 37 - 80 %G   RBC 5.28 4.04 - 5.48 M/uL   Hemoglobin 12.9 12.2 - 16.2 g/dL   HCT, POC 39.0 37.7 - 47.9 %   MCV 74.0 (A) 80 - 97 fL   MCH, POC 24.5 (A) 27 - 31.2 pg   MCHC 33.1 31.8 - 35.4 g/dL   RDW, POC 15.4 %   Platelet Count, POC 204 142 - 424 K/uL   MPV 7.9 0 - 99.8  fL   Assessment & Plan:   1. Dental abscess    Meds ordered this encounter  Medications  . cefTRIAXone (ROCEPHIN) injection 1 g    Sig:     Order Specific Question:  Antibiotic Indication:    Answer:  Cellulitis  . amoxicillin-clavulanate (AUGMENTIN) 875-125 MG tablet    Sig: Take 1 tablet by mouth 2 (two) times daily.    Dispense:  20 tablet    Refill:  0  . HYDROcodone-acetaminophen (NORCO/VICODIN) 5-325 MG tablet    Sig: Take 1 tablet by mouth every 6 (six) hours as needed for moderate pain.    Dispense:  20 tablet    Refill:  0   nsaids for fever      By signing my name below, I, Raven Small, attest that this documentation has been prepared under the direction and in the presence of Tami Lin, MD.  Electronically Signed: Thea Alken, ED Scribe. 07/29/2015. 10:13 AM.  I have completed the patient encounter in its entirety as documented by the  scribe, with editing by me where necessary. Arabella Revelle P. Laney Pastor, M.D.

## 2015-10-26 DIAGNOSIS — E119 Type 2 diabetes mellitus without complications: Secondary | ICD-10-CM | POA: Diagnosis not present

## 2015-10-26 DIAGNOSIS — E669 Obesity, unspecified: Secondary | ICD-10-CM | POA: Diagnosis not present

## 2015-10-26 DIAGNOSIS — E559 Vitamin D deficiency, unspecified: Secondary | ICD-10-CM | POA: Diagnosis not present

## 2015-10-26 DIAGNOSIS — I10 Essential (primary) hypertension: Secondary | ICD-10-CM | POA: Diagnosis not present

## 2016-01-18 DIAGNOSIS — E669 Obesity, unspecified: Secondary | ICD-10-CM | POA: Diagnosis not present

## 2016-01-18 DIAGNOSIS — I10 Essential (primary) hypertension: Secondary | ICD-10-CM | POA: Diagnosis not present

## 2016-01-18 DIAGNOSIS — E119 Type 2 diabetes mellitus without complications: Secondary | ICD-10-CM | POA: Diagnosis not present

## 2016-01-18 DIAGNOSIS — M25571 Pain in right ankle and joints of right foot: Secondary | ICD-10-CM | POA: Diagnosis not present

## 2016-02-01 ENCOUNTER — Encounter: Payer: Self-pay | Admitting: Podiatry

## 2016-02-01 ENCOUNTER — Ambulatory Visit (INDEPENDENT_AMBULATORY_CARE_PROVIDER_SITE_OTHER): Payer: 59 | Admitting: Podiatry

## 2016-02-01 ENCOUNTER — Ambulatory Visit (INDEPENDENT_AMBULATORY_CARE_PROVIDER_SITE_OTHER): Payer: 59

## 2016-02-01 VITALS — BP 143/100 | HR 102 | Resp 16 | Ht 65.0 in | Wt 215.0 lb

## 2016-02-01 DIAGNOSIS — M79671 Pain in right foot: Secondary | ICD-10-CM

## 2016-02-01 DIAGNOSIS — M779 Enthesopathy, unspecified: Secondary | ICD-10-CM | POA: Diagnosis not present

## 2016-02-01 DIAGNOSIS — H5213 Myopia, bilateral: Secondary | ICD-10-CM | POA: Diagnosis not present

## 2016-02-01 DIAGNOSIS — H524 Presbyopia: Secondary | ICD-10-CM | POA: Diagnosis not present

## 2016-02-01 MED ORDER — TRIAMCINOLONE ACETONIDE 10 MG/ML IJ SUSP
10.0000 mg | Freq: Once | INTRAMUSCULAR | Status: AC
  Administered 2016-02-01: 10 mg

## 2016-02-01 NOTE — Progress Notes (Signed)
   Subjective:    Patient ID: Angelica Hogan, female    DOB: Sep 07, 1961, 55 y.o.   MRN: [id number]  HPI Chief Complaint  Patient presents with  . Foot Pain    Right foot; dorsal (near ankle joint); pt stated, "has burning sensation; pain radiating up leg"      Review of Systems  Constitutional: Positive for diaphoresis.  Musculoskeletal: Positive for gait problem.  All other systems reviewed and are negative.      Objective:   Physical Exam        Assessment & Plan:

## 2016-02-03 NOTE — Progress Notes (Signed)
Subjective:     Patient ID: Angelica Hogan Reasons, female   DOB: Nov 13, 1960, 55 y.o.   MRN: [id number]  HPI patient presents with inflammation around the anterior tibial tendon right and indicates that it's been sore for several months and it makes walking difficult and shoe gear uncomfortable   Review of Systems  All other systems reviewed and are negative.      Objective:   Physical Exam  Constitutional: She is oriented to person, place, and time.  Cardiovascular: Intact distal pulses.   Musculoskeletal: Normal range of motion.  Neurological: She is oriented to person, place, and time.  Skin: Skin is warm.  Nursing note and vitals reviewed.  neurovascular status intact muscle strength adequate range of motion within normal limits with patient noted to have inflammation around the anterior tibial tendon as it crosses the ankle joint localized in nature with extensive testing of anterior tibial indicating no current rupture of the tendon. It is sore when pressed and is slightly medial to the anterior tibial tendon. Patient has good digital perfusion well oriented 3     Assessment:     Inflammatory condition which may be a tendinitis type condition or some type of cyst or also could indicate some dysfunction of the tendon itself    Plan:     H&P condition reviewed with patient. I explained there is a chance that there could be a small interstitial tear or that this may be another pathology of the tendon but at this point it may be an inflammatory condition of the sheath or around the tendon. I explained injection treatment and risk and chances for rupture and she wants to accept this risk and today I did careful injection around the area 3 mg dexamethasone Kenalog 5 mg Xylocaine and applied fascial race to reduce mobilization. Instructed on not doing any type of heavy activities for several days and I want her to use heat and ice on this and reappoint for [place] to recheck again in the next 2 weeks  and if no improvement may require MRI  X-ray report indicates that there is inflammation around the anterior tibial tendon but localized in nature and no indications of bony condition

## 2016-02-22 ENCOUNTER — Encounter: Payer: Self-pay | Admitting: Podiatry

## 2016-02-22 ENCOUNTER — Ambulatory Visit (INDEPENDENT_AMBULATORY_CARE_PROVIDER_SITE_OTHER): Payer: 59 | Admitting: Podiatry

## 2016-02-22 DIAGNOSIS — M779 Enthesopathy, unspecified: Secondary | ICD-10-CM | POA: Diagnosis not present

## 2016-02-24 NOTE — Progress Notes (Signed)
Subjective:     Patient ID: Angelica Hogan, female   DOB: 1961/02/14, 55 y.o.   MRN: [id number]  HPI patient states her right foot is feeling a lot better with minimal discomfort when palpated and able to walk without pain   Review of Systems     Objective:   Physical Exam Neurovascular status intact muscle strength adequate significant reduction of edema within the tendon complex with moderate depression of the arch noted    Assessment:     Tendinitis improving quite dramatically at this time    Plan:     Advised patient on ice therapy anti-inflammatories supportive shoe and not going barefoot along with wearing brace. Reappoint to recheck as needed

## 2016-03-10 ENCOUNTER — Telehealth: Payer: Self-pay | Admitting: *Deleted

## 2016-03-10 NOTE — Telephone Encounter (Signed)
Pt states she lost her Plantar Fascia brace and would like another.  I told pt Insurance would not cover at this time and it would cost $100.00.  Pt states she'll look harder.

## 2016-03-18 MED FILL — CARTIA XT 120 MG CAPSULE SA: 120 | 30 days supply | Qty: 30 | Fill #0

## 2016-03-31 MED FILL — LOSARTAN-HCTZ 100-25 MG TAB: 100-25 | 30 days supply | Qty: 30 | Fill #0

## 2016-08-15 MED FILL — LOSARTAN-HCTZ 100-25 MG TAB: 100-25 | 30 days supply | Qty: 30 | Fill #1

## 2020-03-30 ENCOUNTER — Other Ambulatory Visit: Payer: Self-pay

## 2020-03-30 ENCOUNTER — Encounter (HOSPITAL_COMMUNITY): Payer: Self-pay

## 2020-03-30 ENCOUNTER — Emergency Department (HOSPITAL_COMMUNITY): Payer: 59

## 2020-03-30 ENCOUNTER — Inpatient Hospital Stay (HOSPITAL_COMMUNITY)
Admission: EM | Admit: 2020-03-30 | Discharge: 2020-04-01 | DRG: 638 | Disposition: A | Discharge status: Home / Self Care | Payer: 59 | Attending: Internal Medicine | Admitting: Internal Medicine

## 2020-03-30 DIAGNOSIS — E0810 Diabetes mellitus due to underlying condition with ketoacidosis without coma: Secondary | ICD-10-CM | POA: Diagnosis not present

## 2020-03-30 DIAGNOSIS — I10 Essential (primary) hypertension: Secondary | ICD-10-CM | POA: Diagnosis present

## 2020-03-30 DIAGNOSIS — C50919 Malignant neoplasm of unspecified site of unspecified female breast: Secondary | ICD-10-CM | POA: Diagnosis not present

## 2020-03-30 DIAGNOSIS — R739 Hyperglycemia, unspecified: Secondary | ICD-10-CM | POA: Diagnosis present

## 2020-03-30 DIAGNOSIS — N179 Acute kidney failure, unspecified: Secondary | ICD-10-CM | POA: Diagnosis not present

## 2020-03-30 DIAGNOSIS — R8271 Bacteriuria: Secondary | ICD-10-CM | POA: Diagnosis not present

## 2020-03-30 DIAGNOSIS — E860 Dehydration: Secondary | ICD-10-CM | POA: Diagnosis present

## 2020-03-30 DIAGNOSIS — Z20822 Contact with and (suspected) exposure to covid-19: Secondary | ICD-10-CM | POA: Diagnosis present

## 2020-03-30 DIAGNOSIS — Z79899 Other long term (current) drug therapy: Secondary | ICD-10-CM | POA: Diagnosis not present

## 2020-03-30 DIAGNOSIS — Z833 Family history of diabetes mellitus: Secondary | ICD-10-CM | POA: Diagnosis not present

## 2020-03-30 DIAGNOSIS — B3731 Acute candidiasis of vulva and vagina: Secondary | ICD-10-CM | POA: Diagnosis present

## 2020-03-30 DIAGNOSIS — Z803 Family history of malignant neoplasm of breast: Secondary | ICD-10-CM | POA: Diagnosis not present

## 2020-03-30 DIAGNOSIS — Z923 Personal history of irradiation: Secondary | ICD-10-CM | POA: Diagnosis not present

## 2020-03-30 DIAGNOSIS — E1110 Type 2 diabetes mellitus with ketoacidosis without coma: Principal | ICD-10-CM | POA: Diagnosis present

## 2020-03-30 DIAGNOSIS — E876 Hypokalemia: Secondary | ICD-10-CM | POA: Diagnosis present

## 2020-03-30 DIAGNOSIS — E1010 Type 1 diabetes mellitus with ketoacidosis without coma: Secondary | ICD-10-CM | POA: Diagnosis not present

## 2020-03-30 DIAGNOSIS — R718 Other abnormality of red blood cells: Secondary | ICD-10-CM | POA: Diagnosis present

## 2020-03-30 DIAGNOSIS — B373 Candidiasis of vulva and vagina: Secondary | ICD-10-CM | POA: Diagnosis not present

## 2020-03-30 DIAGNOSIS — E1310 Other specified diabetes mellitus with ketoacidosis without coma: Secondary | ICD-10-CM

## 2020-03-30 DIAGNOSIS — Z853 Personal history of malignant neoplasm of breast: Secondary | ICD-10-CM | POA: Diagnosis not present

## 2020-03-30 DIAGNOSIS — E8729 Other acidosis: Secondary | ICD-10-CM | POA: Diagnosis present

## 2020-03-30 DIAGNOSIS — E872 Acidosis: Secondary | ICD-10-CM | POA: Diagnosis present

## 2020-03-30 LAB — BASIC METABOLIC PANEL
Anion gap: 20 — ABNORMAL HIGH (ref 5–15)
Anion gap: 17 — ABNORMAL HIGH (ref 5–15)
Anion gap: 13 (ref 5–15)
BUN: 11 mg/dL (ref 6–20)
BUN: 8 mg/dL (ref 6–20)
BUN: 9 mg/dL (ref 6–20)
CO2: 17 mmol/L — ABNORMAL LOW (ref 22–32)
CO2: 15 mmol/L — ABNORMAL LOW (ref 22–32)
CO2: 16 mmol/L — ABNORMAL LOW (ref 22–32)
Calcium: 9.4 mg/dL (ref 8.9–10.3)
Calcium: 8.7 mg/dL — ABNORMAL LOW (ref 8.9–10.3)
Calcium: 8.3 mg/dL — ABNORMAL LOW (ref 8.9–10.3)
Chloride: 98 mmol/L (ref 98–111)
Chloride: 108 mmol/L (ref 98–111)
Chloride: 111 mmol/L (ref 98–111)
Creatinine, Ser: 1.31 mg/dL — ABNORMAL HIGH (ref 0.44–1.00)
Creatinine, Ser: 1.01 mg/dL — ABNORMAL HIGH (ref 0.44–1.00)
Creatinine, Ser: 0.8 mg/dL (ref 0.44–1.00)
GFR calc Af Amer: 51 mL/min — ABNORMAL LOW (ref >60)
GFR calc Af Amer: >60 mL/min (ref >60)
GFR calc Af Amer: >60 mL/min (ref >60)
GFR calc non Af Amer: 44 mL/min — ABNORMAL LOW (ref >60)
GFR calc non Af Amer: >60 mL/min (ref >60)
GFR calc non Af Amer: >60 mL/min (ref >60)
Glucose, Bld: 752 mg/dL (ref 70–99)
Glucose, Bld: 364 mg/dL — ABNORMAL HIGH (ref 70–99)
Glucose, Bld: 249 mg/dL — ABNORMAL HIGH (ref 70–99)
Potassium: 3.4 mmol/L — ABNORMAL LOW (ref 3.5–5.1)
Potassium: 2.9 mmol/L — ABNORMAL LOW (ref 3.5–5.1)
Potassium: 3.3 mmol/L — ABNORMAL LOW (ref 3.5–5.1)
Sodium: 135 mmol/L (ref 135–145)
Sodium: 140 mmol/L (ref 135–145)
Sodium: 140 mmol/L (ref 135–145)

## 2020-03-30 LAB — URINALYSIS, ROUTINE W REFLEX MICROSCOPIC
Bilirubin Urine: NEGATIVE
Glucose, UA: >=500 mg/dL — AB
Ketones, ur: 80 mg/dL — AB
Nitrite: NEGATIVE
Protein, ur: NEGATIVE mg/dL
Specific Gravity, Urine: 1.032 — ABNORMAL HIGH (ref 1.005–1.030)
pH: 5 (ref 5.0–8.0)

## 2020-03-30 LAB — CBC
HCT: 41.5 % (ref 36.0–46.0)
HCT: 41.1 % (ref 36.0–46.0)
Hemoglobin: 12.8 g/dL (ref 12.0–15.0)
Hemoglobin: 12.7 g/dL (ref 12.0–15.0)
MCH: 24.2 pg — ABNORMAL LOW (ref 26.0–34.0)
MCH: 24.8 pg — ABNORMAL LOW (ref 26.0–34.0)
MCHC: 30.8 g/dL (ref 30.0–36.0)
MCHC: 30.9 g/dL (ref 30.0–36.0)
MCV: 78.3 fL — ABNORMAL LOW (ref 80.0–100.0)
MCV: 80.1 fL (ref 80.0–100.0)
Platelets: 197 10*3/uL (ref 150–400)
Platelets: 184 10*3/uL (ref 150–400)
RBC: 5.3 MIL/uL — ABNORMAL HIGH (ref 3.87–5.11)
RBC: 5.13 MIL/uL — ABNORMAL HIGH (ref 3.87–5.11)
RDW: 14.4 % (ref 11.5–15.5)
RDW: 14.3 % (ref 11.5–15.5)
WBC: 8.8 10*3/uL (ref 4.0–10.5)
WBC: 8.2 10*3/uL (ref 4.0–10.5)
nRBC: 0 % (ref 0.0–0.2)
nRBC: 0 % (ref 0.0–0.2)

## 2020-03-30 LAB — CBG MONITORING, ED
Glucose-Capillary: >600 mg/dL (ref 70–99)
Glucose-Capillary: 450 mg/dL — ABNORMAL HIGH (ref 70–99)
Glucose-Capillary: 552 mg/dL (ref 70–99)
Glucose-Capillary: 339 mg/dL — ABNORMAL HIGH (ref 70–99)
Glucose-Capillary: 233 mg/dL — ABNORMAL HIGH (ref 70–99)
Glucose-Capillary: 222 mg/dL — ABNORMAL HIGH (ref 70–99)

## 2020-03-30 LAB — IRON AND TIBC
Iron: 37 ug/dL (ref 28–170)
Saturation Ratios: 13 % (ref 10.4–31.8)
TIBC: 274 ug/dL (ref 250–450)
UIBC: 237 ug/dL

## 2020-03-30 LAB — BETA-HYDROXYBUTYRIC ACID
Beta-Hydroxybutyric Acid: 5.43 mmol/L — ABNORMAL HIGH (ref 0.05–0.27)
Beta-Hydroxybutyric Acid: 3.58 mmol/L — ABNORMAL HIGH (ref 0.05–0.27)

## 2020-03-30 LAB — I-STAT VENOUS BLOOD GAS, ED
Acid-base deficit: 7 mmol/L — ABNORMAL HIGH (ref 0.0–2.0)
Bicarbonate: 16.7 mmol/L — ABNORMAL LOW (ref 20.0–28.0)
Calcium, Ion: 1.15 mmol/L (ref 1.15–1.40)
HCT: 43 % (ref 36.0–46.0)
Hemoglobin: 14.6 g/dL (ref 12.0–15.0)
O2 Saturation: 94 %
Potassium: 3.4 mmol/L — ABNORMAL LOW (ref 3.5–5.1)
Sodium: 137 mmol/L (ref 135–145)
TCO2: 18 mmol/L — ABNORMAL LOW (ref 22–32)
pCO2, Ven: 27.4 mmHg — ABNORMAL LOW (ref 44.0–60.0)
pH, Ven: 7.393 (ref 7.250–7.430)
pO2, Ven: 71 mmHg — ABNORMAL HIGH (ref 32.0–45.0)

## 2020-03-30 LAB — HEMOGLOBIN A1C
Hgb A1c MFr Bld: 11.4 % — ABNORMAL HIGH (ref 4.8–5.6)
Hgb A1c MFr Bld: 11.5 % — ABNORMAL HIGH (ref 4.8–5.6)
Mean Plasma Glucose: 280.48 mg/dL
Mean Plasma Glucose: 283.35 mg/dL

## 2020-03-30 LAB — OSMOLALITY: Osmolality: 317 mOsm/kg — ABNORMAL HIGH (ref 275–295)

## 2020-03-30 LAB — HIV ANTIBODY (ROUTINE TESTING W REFLEX): HIV Screen 4th Generation wRfx: NONREACTIVE

## 2020-03-30 LAB — SARS CORONAVIRUS 2 BY RT PCR (HOSPITAL ORDER, PERFORMED IN ~~LOC~~ HOSPITAL LAB): SARS Coronavirus 2: NEGATIVE

## 2020-03-30 LAB — GLUCOSE, CAPILLARY
Glucose-Capillary: 225 mg/dL — ABNORMAL HIGH (ref 70–99)
Glucose-Capillary: 172 mg/dL — ABNORMAL HIGH (ref 70–99)
Glucose-Capillary: 182 mg/dL — ABNORMAL HIGH (ref 70–99)

## 2020-03-30 LAB — LACTIC ACID, PLASMA
Lactic Acid, Venous: 2.4 mmol/L (ref 0.5–1.9)
Lactic Acid, Venous: 1.8 mmol/L (ref 0.5–1.9)

## 2020-03-30 LAB — I-STAT BETA HCG BLOOD, ED (MC, WL, AP ONLY): I-stat hCG, quantitative: <5 m[IU]/mL (ref <5)

## 2020-03-30 LAB — PHOSPHORUS: Phosphorus: 2.6 mg/dL (ref 2.5–4.6)

## 2020-03-30 LAB — MAGNESIUM: Magnesium: 2 mg/dL (ref 1.7–2.4)

## 2020-03-30 LAB — FERRITIN: Ferritin: 335 ng/mL — ABNORMAL HIGH (ref 11–307)

## 2020-03-30 MED ORDER — DEXTROSE-NACL 5-0.45 % IV SOLN: INTRAVENOUS | Status: DC

## 2020-03-30 MED ORDER — DEXTROSE 50 % IV SOLN: 0.0000 mL | INTRAVENOUS | Status: DC | PRN

## 2020-03-30 MED ORDER — SODIUM CHLORIDE 0.9 % IV SOLN: INTRAVENOUS | Status: DC

## 2020-03-30 MED ORDER — INSULIN REGULAR(HUMAN) IN NACL 100-0.9 UT/100ML-% IV SOLN
INTRAVENOUS | Status: DC
  Administered 2020-03-30: 5 [IU]/h via INTRAVENOUS

## 2020-03-30 MED ORDER — INSULIN REGULAR(HUMAN) IN NACL 100-0.9 UT/100ML-% IV SOLN
INTRAVENOUS | Status: DC
  Administered 2020-03-30: 13 [IU]/h via INTRAVENOUS
  Filled 2020-03-30: qty 100

## 2020-03-30 MED ORDER — FLUCONAZOLE 150 MG PO TABS
150.0000 mg | ORAL_TABLET | Freq: Once | ORAL | Status: AC
  Administered 2020-03-30: 150 mg via ORAL
  Filled 2020-03-30: qty 1

## 2020-03-30 MED ORDER — SODIUM CHLORIDE 0.9 % IV BOLUS
2000.0000 mL | INTRAVENOUS | Status: AC
  Administered 2020-03-30: 2000 mL via INTRAVENOUS

## 2020-03-30 MED ORDER — ENOXAPARIN SODIUM 40 MG/0.4ML ~~LOC~~ SOLN
40.0000 mg | SUBCUTANEOUS | Status: DC
  Administered 2020-03-30: 40 mg via SUBCUTANEOUS
  Filled 2020-03-30: qty 0.4

## 2020-03-30 MED ORDER — POTASSIUM CHLORIDE 10 MEQ/100ML IV SOLN
10.0000 meq | Freq: Two times a day (BID) | INTRAVENOUS | Status: DC
  Administered 2020-03-30: 10 meq via INTRAVENOUS
  Filled 2020-03-30: qty 100

## 2020-03-30 MED ORDER — HYDRALAZINE HCL 20 MG/ML IJ SOLN
10.0000 mg | Freq: Four times a day (QID) | INTRAMUSCULAR | Status: DC | PRN
  Administered 2020-03-31: 10 mg via INTRAVENOUS
  Filled 2020-03-30: qty 1

## 2020-03-30 MED ORDER — POTASSIUM CHLORIDE 10 MEQ/100ML IV SOLN
10.0000 meq | INTRAVENOUS | Status: AC
  Administered 2020-03-30 (×4): 10 meq via INTRAVENOUS
  Filled 2020-03-30 (×4): qty 100

## 2020-03-30 NOTE — H&P (Signed)
History and Physical    Angelica Hogan:[id number] DOB: Jan 14, 1961 DOA: 03/30/2020  PCP: Rogers Blocker, MD  Patient coming from: Home  I have personally briefly reviewed patient's old medical records in [place]  Chief Complaint: Elevated blood sugar  HPI: Angelica Hogan is a 59 y.o. female with medical history significant of hypertension, right breast cancer status post radiation therapy in 2012 presents to emergency department with hyperglycemia and polyuria since 1 week.  Patient tells me that she had craving for soda.  She was drinking about 3 sodas per day.  About 3 to 4 days ago she switched and began drinking a whole lot of water.  She had excessive urination and was urinating almost about every 30 minutes or so.  This morning she started feeling fatigue and foggy.  She works in a cardiology office.  She asked the nurse who checked her blood sugar which was noted to be high multiple times therefore she was advised to go to the ER for further evaluation and management.  Has a strong family history of diabetes in Dad & aunt.  Reports blurry vision however denies headache, chest pain, lightheadedness, dizziness, shortness of breath, palpitation, nausea, vomiting, fever, chills, cough, congestion, abdominal pain, dysuria, hematuria, foul-smelling urine, back pain numbness weakness tingling sensation in hands or feet or ulcers or sores in feet.  Patient tells me that she has whitish non-foul-smelling vaginal discharge since 1 week.  She is not sexually active.  Denies previous history of STDs.  She denies itching or burning sensation.  She tells me that she is stressed about as she has to take care of her dad and her mom has dementia.  She denies depressed mood.  No history of smoking, alcohol, illicit drug use.  ED Course: Upon arrival to ED: Patient blood pressure elevated, blood glucose: 752, afebrile with no leukocytosis.  CMP shows potassium of 3.4, AKI, anion gap 20, CO2 17.   CBC shows microcytosis, UA positive for leukocytes and bacteria.  Serum osmolality: 317.  Beta hydroxybutyric acid, A1c, COVID-19 pending.  Chest x-ray negative for acute findings.  Patient started on insulin drip.  Potassium replaced in ED.  [facility] hospitalist consulted for admission for new onset DKA. Review of Systems: As per HPI otherwise negative.    Past Medical History:  Diagnosis Date  . Breast cancer ([place])    DCIS  . 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 Fibroids   . Malignant neoplasm of central portion of female breast ([place]) 08/18/2011    Past Surgical History:  Procedure Laterality Date  . BREAST LUMPECTOMY  06/12/2011   rt breast  . HERNIA REPAIR  2010  . NASAL SINUS SURGERY     1980's     reports that she has never smoked. She does not have any smokeless tobacco history on file. She reports that she does not drink alcohol. No history on file for drug use.  No Known Allergies  Family History  Problem Relation Age of Onset  . Breast cancer Paternal Aunt   . Cancer Paternal Aunt   . Breast cancer Paternal Grandmother   . Breast cancer Paternal Aunt   . Breast cancer Cousin   . Cancer Cousin   . Breast cancer Cousin   . Cancer Maternal Grandmother     Prior to Admission medications   Medication Sig Start Date End Date Taking? Authorizing Provider  acetaminophen (TYLENOL) 325 MG tablet Take 650 mg by  mouth every 6 (six) hours as needed for moderate pain.    Yes [provider]  Biotin 10 MG TABS Take 1 tablet by mouth daily.   Yes [provider]  Multiple Vitamins-Minerals (EYE VITAMINS & MINERALS) TABS Take 1 tablet by mouth daily.   Yes [provider]  Multiple Vitamins-Minerals (ONE-A-DAY WOMENS MIND & BODY) TABS Take 1 tablet by mouth daily.   Yes [provider]  Probiotic Product (PROBIOTIC DAILY PO) Take 1 capsule by mouth daily.    Yes [provider]  Vitamin D, Cholecalciferol, 1000 units TABS  Take 1 tablet by mouth once a week.    Yes [provider]  losartan-hydrochlorothiazide (HYZAAR) 100-25 MG tablet Take 1 tablet by mouth daily.  10/26/15   [provider]    Physical Exam: Vitals:   03/30/20 1434 03/30/20 1700 03/30/20 1730  BP: (!) 175/111 (!) 209/108 (!) 137/91  Pulse: (!) 111 91 84  Resp: 20 20 20   Temp: 99.6 F (37.6 C)    TempSrc: Oral    SpO2: 99% 100% 99%  Weight: 102.5 kg    Height: 5\' 5"  (1.651 m)      Constitutional: NAD, calm, comfortable, on room air, communicating well Eyes: PERRL, lids and conjunctivae normal ENMT: Mucous membranes are moist. Posterior pharynx clear of any exudate or lesions.Normal dentition.  Neck: normal, supple, no masses, no thyromegaly Respiratory: clear to auscultation bilaterally, no wheezing, no crackles. Normal respiratory effort. No accessory muscle use.  Cardiovascular: Regular rate and rhythm, no murmurs / rubs / gallops. No extremity edema. 2+ pedal pulses. No carotid bruits.  Abdomen: no tenderness, no masses palpated. No hepatosplenomegaly. Bowel sounds positive.  Musculoskeletal: no clubbing / cyanosis. No joint deformity upper and lower extremities. Good ROM, no contractures. Normal muscle tone.  Skin: no rashes, lesions, ulcers. No induration Neurologic: CN 2-12 grossly intact. Sensation intact, DTR normal. Strength 5/5 in all 4.  Psychiatric: Normal judgment and insight. Alert and oriented x 3. Normal mood.    Labs on Admission: I have personally reviewed following labs and imaging studies  CBC: Recent Labs  Lab 03/30/20 1456 03/30/20 1639  WBC 8.8  --   HGB 12.8 14.6  HCT 41.5 43.0  MCV 78.3*  --   PLT 197  --    Basic Metabolic Panel: Recent Labs  Lab 03/30/20 1456 03/30/20 1639  NA 135 137  K 3.4* 3.4*  CL 98  --   CO2 17*  --   GLUCOSE 752*  --   BUN 11  --   CREATININE 1.31*  --   CALCIUM 9.4  --    GFR: Estimated Creatinine Clearance: 54.2 mL/min (A) (by C-G formula  based on SCr of 1.31 mg/dL (H)). Liver Function Tests: No results for input(s): AST, ALT, ALKPHOS, BILITOT, PROT, ALBUMIN in the last 168 hours. No results for input(s): LIPASE, AMYLASE in the last 168 hours. No results for input(s): AMMONIA in the last 168 hours. Coagulation Profile: No results for input(s): INR, PROTIME in the last 168 hours. Cardiac Enzymes: No results for input(s): CKTOTAL, CKMB, CKMBINDEX, TROPONINI in the last 168 hours. BNP (last 3 results) No results for input(s): PROBNP in the last 8760 hours. HbA1C: Recent Labs    03/30/20 1507  HGBA1C 11.4*   CBG: Recent Labs  Lab 03/30/20 1441 03/30/20 1639 03/30/20 1719 03/30/20 1751  GLUCAP >600* 552* 450* 339*   Lipid Profile: No results for input(s): CHOL, HDL, LDLCALC, TRIG, CHOLHDL, LDLDIRECT  in the last 72 hours. Thyroid Function Tests: No results for input(s): TSH, T4TOTAL, FREET4, T3FREE, THYROIDAB in the last 72 hours. Anemia Panel: No results for input(s): VITAMINB12, FOLATE, FERRITIN, TIBC, IRON, RETICCTPCT in the last 72 hours. Urine analysis:    Component Value Date/Time   COLORURINE STRAW (A) 03/30/2020 1600   APPEARANCEUR CLEAR 03/30/2020 1600   LABSPEC 1.032 (H) 03/30/2020 1600   PHURINE 5.0 03/30/2020 1600   GLUCOSEU >=500 (A) 03/30/2020 1600   HGBUR SMALL (A) 03/30/2020 1600   BILIRUBINUR NEGATIVE 03/30/2020 1600   KETONESUR 80 (A) 03/30/2020 1600   PROTEINUR NEGATIVE 03/30/2020 1600   UROBILINOGEN 0.2 12/25/2010 1400   NITRITE NEGATIVE 03/30/2020 1600   LEUKOCYTESUR MODERATE (A) 03/30/2020 1600    Radiological Exams on Admission: DG Chest Portable 1 View  Result Date: 03/30/2020 CLINICAL DATA:  DKA EXAM: PORTABLE CHEST 1 VIEW COMPARISON:  06/10/2011 FINDINGS: Heart and mediastinal contours are within normal limits. No focal opacities or effusions. No acute bony abnormality. IMPRESSION: No active disease. Electronically Signed   By: Rolm Baptise M.D.   On: 03/30/2020 17:40      Assessment/Plan Principal Problem:   DKA (diabetic ketoacidoses) (HCC) Active Problems:   Hyperglycemia   Hypokalemia   AKI (acute kidney injury) ([place])   High anion gap metabolic acidosis   Microcytosis   Breast cancer (HCC)   Asymptomatic bacteriuria   Vaginal candidiasis    New onset DKA: -Patient presented with blood glucose of 752, CO2: 17, anion gap: 20, osmolality: 317. -Beta hydroxybutyric acid, A1c, COVID-19 pending.  Chest x-ray negative for acute infection.  Patient afebrile with no leukocytosis. -Patient received IV fluid boluses x2.  Started on insulin drip in ED. -Admit patient to stepdown unit for close monitoring. -Continue insulin drip.  Continue IV fluids.  Monitor blood sugar closely.  Continue normal sling when blood sugar more than 250, change IV fluids to D5 half-normal saline when blood sugar is less than 250 -Monitor electrolytes closely.  BMP every 4 hours.  Check magnesium, phosphorus, lactic acid, C-peptide level. -Monitor vitals closely. -Consult diabetic counselor  AKI: Likely secondary to dehydration -Continue IV fluids.  Avoid nephrotoxic medication.  Repeat BMP tomorrow a.m.  Hypokalemia: Replenished -Check magnesium level.  Repeat BMP tomorrow a.m.  Microcytosis: H&H is stable -Check iron studies.  Hypertension: Blood pressure elevated upon arrival.  Currently stable -Hold Hyzaar for now due to AKI -Hydralazine as needed.  Monitor blood pressure closely.  History of right breast cancer status post radiation therapy in 2012: Aware  Asymptomatic bacteriuria: -Patient UA is positive for leukocytes and bacteria.  She denies any urinary symptoms.  She is afebrile with no leukocytosis -We will hold off antibiotics at this time  Vaginal candidiasis: -Patient reports whitish non-foul-smelling discharge since 1 week -UA is positive for yeast -Fluconazole 150 p.o. once.   DVT prophylaxis: Lovenox/SCD Code Status: Full code Family  Communication: None present at bedside.  Plan of care discussed with patient in length and she verbalized understanding and agreed with it. Disposition Plan: Likely home in 2 days Consults called: None Admission status: Inpatient   Mckinley Jewel MD [facility]  If 7PM-7AM, please contact night-coverage [email or web address] Password [email or web address]  03/30/2020, 6:01 PM

## 2020-03-30 NOTE — ED Provider Notes (Signed)
[facility] EMERGENCY DEPARTMENT Provider Note   CSN: [id number] Arrival date & time: 03/30/20  1423     History Chief Complaint  Patient presents with  . Hyperglycemia    Angelica Hogan is a 59 y.o. female history of breast cancer, presented to emergency department with hyperglycemia and polyuria.  Patient reports that she has been told in the past several years ago that she was "prediabetic".  She has a family history of diabetes but she herself is never been diagnosed with diabetes does not take any blood sugar controlling medications.  She reports that about a week ago she began craving a lot of soda.  She was drinking about 3 sodas per day.  She says about 3 to 4 days ago, she switched and began drinking a whole lot of water.  She reports that she had excessive urination and was urinating once every 30 minutes or so.  She felt generally fatigued and dehydrated today.  She does report she had a mild headache as well as blurred vision earlier.  She denies any fevers or chills.  She denies any dysuria.  She denies any cough or congestion.  She has not received Covid vaccines.  She denies any nausea or vomiting or abdominal pain.  NKDA  HPI     Past Medical History:  Diagnosis Date  . Breast cancer ([place])    DCIS  . 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 Fibroids   . Malignant neoplasm of central portion of female breast ([place]) 08/18/2011    Patient Active Problem List   Diagnosis Date Noted  . Hyperglycemia 03/30/2020  . Hypokalemia 03/30/2020  . AKI (acute kidney injury) ([place]) 03/30/2020  . High anion gap metabolic acidosis 74/08/8785  . Microcytosis 03/30/2020  . DKA (diabetic ketoacidoses) ([place]) 03/30/2020  . Breast cancer ([place])   . Asymptomatic bacteriuria   . Vaginal candidiasis   . Malignant neoplasm of central portion of female breast ([place]) 08/18/2011  . Carcinoma in situ of breast, Right, TisN0 05/07/2011    Past Surgical  History:  Procedure Laterality Date  . BREAST LUMPECTOMY  06/12/2011   rt breast  . HERNIA REPAIR  2010  . NASAL SINUS SURGERY     1980's     OB History   No obstetric history on file.     Family History  Problem Relation Age of Onset  . Breast cancer Paternal Aunt   . Cancer Paternal Aunt   . Breast cancer Paternal Grandmother   . Breast cancer Paternal Aunt   . Breast cancer Cousin   . Cancer Cousin   . Breast cancer Cousin   . Cancer Maternal Grandmother     Social History   Tobacco Use  . Smoking status: Never Smoker  . Smokeless tobacco: Never Used  Vaping Use  . Vaping Use: Never used  Substance Use Topics  . Alcohol use: No  . Drug use: Never    Home Medications Prior to Admission medications   Medication Sig Start Date End Date Taking? Authorizing Provider  acetaminophen (TYLENOL) 325 MG tablet Take 650 mg by mouth every 6 (six) hours as needed for moderate pain.    Yes [provider]  Biotin 10 MG TABS Take 1 tablet by mouth daily.   Yes [provider]  Multiple Vitamins-Minerals (EYE VITAMINS & MINERALS) TABS Take 1 tablet by mouth daily.   Yes [provider]  Multiple Vitamins-Minerals (ONE-A-DAY WOMENS MIND &  BODY) TABS Take 1 tablet by mouth daily.   Yes [provider]  Probiotic Product (PROBIOTIC DAILY PO) Take 1 capsule by mouth daily.    Yes [provider]  Vitamin D, Cholecalciferol, 1000 units TABS Take 1 tablet by mouth once a week.    Yes [provider]  losartan-hydrochlorothiazide (HYZAAR) 100-25 MG tablet Take 1 tablet by mouth daily.  10/26/15   [provider]    Allergies    Patient has no known allergies.  Review of Systems   Review of Systems  Constitutional: Positive for fatigue. Negative for chills and fever.  HENT: Negative for ear pain and sore throat.   Eyes: Positive for visual disturbance. Negative for photophobia.  Respiratory: Negative for cough and  shortness of breath.   Cardiovascular: Negative for chest pain and palpitations.  Gastrointestinal: Negative for abdominal pain, diarrhea and vomiting.  Endocrine: Positive for polydipsia and polyuria.  Genitourinary: Negative for dysuria and hematuria.  Musculoskeletal: Negative for arthralgias and myalgias.  Skin: Negative for pallor and rash.  Neurological: Positive for headaches. Negative for syncope.  Psychiatric/Behavioral: Negative for agitation.  All other systems reviewed and are negative.   Physical Exam Updated Vital Signs BP 127/74 (BP Location: Right Arm)   Pulse 66   Temp 98.1 F (36.7 C) (Oral)   Resp 20   Ht 5\' 5"  (1.651 m)   Wt 102.5 kg   SpO2 97%   BMI 37.61 kg/m   Physical Exam Vitals and nursing note reviewed.  Constitutional:      General: She is not in acute distress.    Appearance: She is well-developed.  HENT:     Head: Normocephalic and atraumatic.  Eyes:     Conjunctiva/sclera: Conjunctivae normal.  Cardiovascular:     Rate and Rhythm: Regular rhythm. Tachycardia present.     Pulses: Normal pulses.  Pulmonary:     Effort: Pulmonary effort is normal. No respiratory distress.  Abdominal:     Palpations: Abdomen is soft.     Tenderness: There is no abdominal tenderness.  Musculoskeletal:     Cervical back: Neck supple.  Skin:    General: Skin is warm and dry.  Neurological:     General: No focal deficit present.     Mental Status: She is alert and oriented to person, place, and time.  Psychiatric:        Mood and Affect: Mood normal.        Behavior: Behavior normal.     ED Results / Procedures / Treatments   Labs (all labs ordered are listed, but only abnormal results are displayed) Labs Reviewed  BASIC METABOLIC PANEL - Abnormal; Notable for the following components:      Result Value   Potassium 3.4 (*)    CO2 17 (*)    Glucose, Bld 752 (*)    Creatinine, Ser 1.31 (*)    GFR calc non Af Amer 44 (*)    GFR calc Af Amer 51 (*)     Anion gap 20 (*)    All other components within normal limits  CBC - Abnormal; Notable for the following components:   RBC 5.30 (*)    MCV 78.3 (*)    MCH 24.2 (*)    All other components within normal limits  URINALYSIS, ROUTINE W REFLEX MICROSCOPIC - Abnormal; Notable for the following components:   Color, Urine STRAW (*)    Specific Gravity, Urine 1.032 (*)    Glucose, UA >=500 (*)  Hgb urine dipstick SMALL (*)    Ketones, ur 80 (*)    Leukocytes,Ua MODERATE (*)    Bacteria, UA FEW (*)    All other components within normal limits  BETA-HYDROXYBUTYRIC ACID - Abnormal; Notable for the following components:   Beta-Hydroxybutyric Acid 5.43 (*)    All other components within normal limits  OSMOLALITY - Abnormal; Notable for the following components:   Osmolality 317 (*)    All other components within normal limits  HEMOGLOBIN A1C - Abnormal; Notable for the following components:   Hgb A1c MFr Bld 11.4 (*)    All other components within normal limits  LACTIC ACID, PLASMA - Abnormal; Notable for the following components:   Lactic Acid, Venous 2.4 (*)    All other components within normal limits  FERRITIN - Abnormal; Notable for the following components:   Ferritin 335 (*)    All other components within normal limits  CBC - Abnormal; Notable for the following components:   RBC 5.13 (*)    MCH 24.8 (*)    All other components within normal limits  BASIC METABOLIC PANEL - Abnormal; Notable for the following components:   Potassium 2.9 (*)    CO2 15 (*)    Glucose, Bld 364 (*)    Creatinine, Ser 1.01 (*)    Calcium 8.7 (*)    Anion gap 17 (*)    All other components within normal limits  BASIC METABOLIC PANEL - Abnormal; Notable for the following components:   Potassium 3.3 (*)    CO2 16 (*)    Glucose, Bld 249 (*)    Calcium 8.3 (*)    All other components within normal limits  BETA-HYDROXYBUTYRIC ACID - Abnormal; Notable for the following components:    Beta-Hydroxybutyric Acid 3.58 (*)    All other components within normal limits  HEMOGLOBIN A1C - Abnormal; Notable for the following components:   Hgb A1c MFr Bld 11.5 (*)    All other components within normal limits  GLUCOSE, CAPILLARY - Abnormal; Notable for the following components:   Glucose-Capillary 225 (*)    All other components within normal limits  GLUCOSE, CAPILLARY - Abnormal; Notable for the following components:   Glucose-Capillary 172 (*)    All other components within normal limits  GLUCOSE, CAPILLARY - Abnormal; Notable for the following components:   Glucose-Capillary 182 (*)    All other components within normal limits  GLUCOSE, CAPILLARY - Abnormal; Notable for the following components:   Glucose-Capillary 179 (*)    All other components within normal limits  GLUCOSE, CAPILLARY - Abnormal; Notable for the following components:   Glucose-Capillary 190 (*)    All other components within normal limits  CBG MONITORING, ED - Abnormal; Notable for the following components:   Glucose-Capillary >600 (*)    All other components within normal limits  I-STAT VENOUS BLOOD GAS, ED - Abnormal; Notable for the following components:   pCO2, Ven 27.4 (*)    pO2, Ven 71.0 (*)    Bicarbonate 16.7 (*)    TCO2 18 (*)    Acid-base deficit 7.0 (*)    Potassium 3.4 (*)    All other components within normal limits  CBG MONITORING, ED - Abnormal; Notable for the following components:   Glucose-Capillary 552 (*)    All other components within normal limits  CBG MONITORING, ED - Abnormal; Notable for the following components:   Glucose-Capillary 450 (*)    All other components within normal limits  CBG  MONITORING, ED - Abnormal; Notable for the following components:   Glucose-Capillary 339 (*)    All other components within normal limits  CBG MONITORING, ED - Abnormal; Notable for the following components:   Glucose-Capillary 233 (*)    All other components within normal limits    CBG MONITORING, ED - Abnormal; Notable for the following components:   Glucose-Capillary 222 (*)    All other components within normal limits  SARS CORONAVIRUS 2 BY RT PCR (HOSPITAL ORDER, [facility])  LACTIC ACID, PLASMA  IRON AND TIBC  HIV ANTIBODY (ROUTINE TESTING W REFLEX)  MAGNESIUM  PHOSPHORUS  C-PEPTIDE  BASIC METABOLIC PANEL  BASIC METABOLIC PANEL  BETA-HYDROXYBUTYRIC ACID  BASIC METABOLIC PANEL  BETA-HYDROXYBUTYRIC ACID  I-STAT BETA HCG BLOOD, ED ([facility], [facility], [facility] ONLY)    EKG None  Radiology DG Chest Portable 1 View  Result Date: 03/30/2020 CLINICAL DATA:  DKA EXAM: PORTABLE CHEST 1 VIEW COMPARISON:  06/10/2011 FINDINGS: Heart and mediastinal contours are within normal limits. No focal opacities or effusions. No acute bony abnormality. IMPRESSION: No active disease. Electronically Signed   By: Rolm Baptise M.D.   On: 03/30/2020 17:40    Procedures .Critical Care Performed by: Wyvonnia Dusky, MD Authorized by: Wyvonnia Dusky, MD   Critical care provider statement:    Critical care time (minutes):  45   Critical care was necessary to treat or prevent imminent or life-threatening deterioration of the following conditions:  Endocrine crisis   Critical care was time spent personally by me on the following activities:  Discussions with consultants, evaluation of patient's response to treatment, examination of patient, ordering and performing treatments and interventions, ordering and review of laboratory studies, ordering and review of radiographic studies, pulse oximetry, re-evaluation of patient's condition, obtaining history from patient or surrogate and review of old charts Comments:     Hyperglycemic crisis requiring IV insulin and potassium repletion   (including critical care time)  Medications Ordered in ED Medications  enoxaparin (LOVENOX) injection 40 mg (40 mg Subcutaneous Given 03/30/20 2121)  insulin regular, human (MYXREDLIN) 100  units/ 100 mL infusion (3.6 Units/hr Intravenous Rate/Dose Change 03/30/20 2001)  0.9 %  sodium chloride infusion ( Intravenous Stopped 03/30/20 1954)  dextrose 5 %-0.45 % sodium chloride infusion ( Intravenous New Bag/Given 03/30/20 1906)  dextrose 50 % solution 0-50 mL (has no administration in time range)  hydrALAZINE (APRESOLINE) injection 10 mg (has no administration in time range)  sodium chloride 0.9 % bolus 2,000 mL (0 mLs Intravenous Stopped 03/30/20 1906)  potassium chloride 10 mEq in 100 mL IVPB (10 mEq Intravenous Not Given 03/30/20 2332)  fluconazole (DIFLUCAN) tablet 150 mg (150 mg Oral Given 03/30/20 2122)    ED Course  I have reviewed the triage vital signs and the nursing notes.  Pertinent labs & imaging results that were available during my care of the patient were reviewed by me and considered in my medical decision making (see chart for details).  59 year old female present emergency department with symptomatic hyperglycemia ongoing for several days.  She presents with polyuria and polydipsia.  She does have an anion gap of 20.  She has not been able to provide a urine sample yet.  We will check for ketones in the urine as well as a venous gas to see if she is truly in DKA or simply diabetic ketosis.  I suspect is likely related to her increase sugar consumption.  However I will also obtain an  infection rule out including Covid testing, x-ray of the chest, UA.  We will start the patient on IV insulin as well as give her 2 L fluid bolus.  Because her potassium is 3.4, will begin replating IV potassium as well.  She is generally well-appearing on exam.  She has no kausmaul respirations.  She does not appear toxic.  She is stable on room air.   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  Anion gap 20.    Likely dietary and lifestyle trigger for episodes today.  Covid negative UA without sign of infection  IV insulin, IV fluids and IV potassium  ordered Patient to be admitted for glucose stabilization, initiating of antiglycemic agents.  She agrees with workup and plan.   Clinical Course as of Mar 31 126  Fri Mar 30, 2020  1645 Ketones, ur(!): 80 [MT]  1645 Anion gap(!): 20 [MT]  1646 PH 7.39, no acidosis.  Bicarb 16.7.   [MT]  1733 Admitted to dr Cletus Gash hospitalist   [MT]    Clinical Course User Index [MT] Langston Masker Carola Rhine, MD    Final Clinical Impression(s) / ED Diagnoses Final diagnoses:  Diabetic ketosis [facility])    Rx / DC Orders ED Discharge Orders    None       Langston Masker Carola Rhine, MD 03/31/20 0127

## 2020-03-30 NOTE — ED Triage Notes (Signed)
Pt arrives to ED w/ c/o hyperglycemia. Pt has no diagnosis of DM. Pt CBG HI in triage. Pt c/o feeling thirsty and frequent urination.

## 2020-03-31 DIAGNOSIS — C50919 Malignant neoplasm of unspecified site of unspecified female breast: Secondary | ICD-10-CM

## 2020-03-31 DIAGNOSIS — N179 Acute kidney failure, unspecified: Secondary | ICD-10-CM

## 2020-03-31 DIAGNOSIS — E1110 Type 2 diabetes mellitus with ketoacidosis without coma: Principal | ICD-10-CM

## 2020-03-31 DIAGNOSIS — R8271 Bacteriuria: Secondary | ICD-10-CM

## 2020-03-31 DIAGNOSIS — E876 Hypokalemia: Secondary | ICD-10-CM

## 2020-03-31 DIAGNOSIS — B373 Candidiasis of vulva and vagina: Secondary | ICD-10-CM

## 2020-03-31 LAB — BASIC METABOLIC PANEL
Anion gap: 9 (ref 5–15)
BUN: 6 mg/dL (ref 6–20)
CO2: 18 mmol/L — ABNORMAL LOW (ref 22–32)
Calcium: 8.2 mg/dL — ABNORMAL LOW (ref 8.9–10.3)
Chloride: 112 mmol/L — ABNORMAL HIGH (ref 98–111)
Creatinine, Ser: 0.67 mg/dL (ref 0.44–1.00)
GFR calc Af Amer: >60 mL/min (ref >60)
GFR calc non Af Amer: >60 mL/min (ref >60)
Glucose, Bld: 180 mg/dL — ABNORMAL HIGH (ref 70–99)
Potassium: 3.2 mmol/L — ABNORMAL LOW (ref 3.5–5.1)
Sodium: 139 mmol/L (ref 135–145)

## 2020-03-31 LAB — GLUCOSE, CAPILLARY
Glucose-Capillary: 157 mg/dL — ABNORMAL HIGH (ref 70–99)
Glucose-Capillary: 164 mg/dL — ABNORMAL HIGH (ref 70–99)
Glucose-Capillary: 165 mg/dL — ABNORMAL HIGH (ref 70–99)
Glucose-Capillary: 169 mg/dL — ABNORMAL HIGH (ref 70–99)
Glucose-Capillary: 169 mg/dL — ABNORMAL HIGH (ref 70–99)
Glucose-Capillary: 176 mg/dL — ABNORMAL HIGH (ref 70–99)
Glucose-Capillary: 179 mg/dL — ABNORMAL HIGH (ref 70–99)
Glucose-Capillary: 179 mg/dL — ABNORMAL HIGH (ref 70–99)
Glucose-Capillary: 181 mg/dL — ABNORMAL HIGH (ref 70–99)
Glucose-Capillary: 190 mg/dL — ABNORMAL HIGH (ref 70–99)
Glucose-Capillary: 190 mg/dL — ABNORMAL HIGH (ref 70–99)
Glucose-Capillary: 208 mg/dL — ABNORMAL HIGH (ref 70–99)
Glucose-Capillary: 277 mg/dL — ABNORMAL HIGH (ref 70–99)
Glucose-Capillary: 341 mg/dL — ABNORMAL HIGH (ref 70–99)

## 2020-03-31 LAB — C-PEPTIDE: C-Peptide: 0.9 ng/mL — ABNORMAL LOW (ref 1.1–4.4)

## 2020-03-31 LAB — BETA-HYDROXYBUTYRIC ACID: Beta-Hydroxybutyric Acid: 0.44 mmol/L — ABNORMAL HIGH (ref 0.05–0.27)

## 2020-03-31 MED ORDER — LOSARTAN POTASSIUM 50 MG PO TABS
100.0000 mg | ORAL_TABLET | Freq: Every day | ORAL | Status: DC
  Administered 2020-03-31 – 2020-04-01 (×2): 100 mg via ORAL
  Filled 2020-03-31 (×2): qty 2

## 2020-03-31 MED ORDER — SODIUM CHLORIDE 0.9 % IV SOLN: INTRAVENOUS | Status: DC

## 2020-03-31 MED ORDER — INSULIN STARTER KIT- PEN NEEDLES (ENGLISH)
1.0000 | Freq: Once | Status: AC
  Administered 2020-03-31: 1
  Filled 2020-03-31: qty 1

## 2020-03-31 MED ORDER — POTASSIUM CHLORIDE CRYS ER 20 MEQ PO TBCR
40.0000 meq | EXTENDED_RELEASE_TABLET | ORAL | Status: AC
  Administered 2020-03-31 (×2): 40 meq via ORAL
  Filled 2020-03-31 (×2): qty 2

## 2020-03-31 MED ORDER — HYDROCHLOROTHIAZIDE 25 MG PO TABS
25.0000 mg | ORAL_TABLET | Freq: Every day | ORAL | Status: DC
  Administered 2020-03-31 – 2020-04-01 (×2): 25 mg via ORAL
  Filled 2020-03-31 (×2): qty 1

## 2020-03-31 MED ORDER — INSULIN ASPART 100 UNIT/ML ~~LOC~~ SOLN
0.0000 [IU] | Freq: Every day | SUBCUTANEOUS | Status: DC
  Administered 2020-03-31: 4 [IU] via SUBCUTANEOUS

## 2020-03-31 MED ORDER — LOSARTAN POTASSIUM-HCTZ 100-25 MG PO TABS: 1.0000 | ORAL_TABLET | Freq: Every day | ORAL | Status: DC

## 2020-03-31 MED ORDER — INSULIN GLARGINE 100 UNIT/ML ~~LOC~~ SOLN
10.0000 [IU] | Freq: Every day | SUBCUTANEOUS | Status: DC
  Administered 2020-03-31: 10 [IU] via SUBCUTANEOUS
  Filled 2020-03-31: qty 0.1

## 2020-03-31 MED ORDER — INSULIN GLARGINE 100 UNIT/ML ~~LOC~~ SOLN
15.0000 [IU] | Freq: Every day | SUBCUTANEOUS | Status: DC
  Filled 2020-03-31: qty 0.15

## 2020-03-31 MED ORDER — INSULIN GLARGINE 100 UNIT/ML ~~LOC~~ SOLN
5.0000 [IU] | Freq: Once | SUBCUTANEOUS | Status: DC
  Filled 2020-03-31: qty 0.05

## 2020-03-31 MED ORDER — INSULIN ASPART 100 UNIT/ML ~~LOC~~ SOLN
0.0000 [IU] | Freq: Three times a day (TID) | SUBCUTANEOUS | Status: DC
  Administered 2020-03-31: 2 [IU] via SUBCUTANEOUS
  Administered 2020-03-31: 5 [IU] via SUBCUTANEOUS
  Administered 2020-04-01: 3 [IU] via SUBCUTANEOUS
  Administered 2020-04-01: 5 [IU] via SUBCUTANEOUS

## 2020-03-31 NOTE — Progress Notes (Signed)
Inpatient Diabetes Program Recommendations  AACE/ADA: New Consensus Statement on Inpatient Glycemic Control (2015)  Target Ranges:  Prepandial:   less than 140 mg/dL      Peak postprandial:   less than 180 mg/dL (1-2 hours)      Critically ill patients:  140 - 180 mg/dL   Lab Results  Component Value Date   GLUCAP 169 (H) 03/31/2020   HGBA1C 11.5 (H) 03/30/2020     Diabetes history:  Pre-diabetes  Outpatient Diabetes medications:  None  Current orders for Inpatient glycemic control:  IV insulin  Inpatient Diabetes Program Recommendations:     When appropriate to transition to sq insulin:  Lantus 20 units (0.2 units/kg)1-2 hrs prior to stopping drip  Novolog 0-9 q4h x 24 hr,  then tid with 0-5 units qhs  Carb modified diet  Will speak to patient today.  Will continue to follow while inpatient.  Thank you, Reche Dixon, RN, BSN Diabetes Coordinator Inpatient Diabetes Program [phone] (team pager from 8a-5p)

## 2020-03-31 NOTE — Plan of Care (Signed)
Nutrition Education Note RD consulted for nutrition education regarding diabetes.  RD working remotely.  RD spoke with patient via phone this afternoon. She reports feeling better today, dinner tray in room, recalls spaghetti, salad, and roll. Patient reports that she is familiar with dietary recommendations due to family history of diabetes, states that her father follows a strict diet and no longer requires medication. RD answered patient questions regarding portion sizes, balanced meals, and encouraged patient to utilize ADA web site. Lab Results  Component Value Date   HGBA1C 11.5 (H) 03/30/2020   RD discussed"Heart Healthy Consistent Carbohydrate" handout from the Academy of [employer] and Dietetics. Handout has been attached to discharge instructions. Discussed different food groups and their effects on blood sugar, emphasizing carbohydrate-containing foods. Provided list of carbohydrates and recommended serving sizes of common foods.  Discussed importance of controlled and consistent carbohydrate intake throughout the day. Provided examples of ways to balance meals/snacks and encouraged intake of high-fiber, whole grain complex carbohydrates. Teach back method used. Patient expressed interest in attending outpatient diabetes education class, consider referral.   Expect good compliance.  Body mass index is 37.24 kg/m. Pt meets criteria for obese based on current BMI.  Current diet order is CM, patient is consuming approximately 75% of meals at this time. Labs and medications reviewed. No further nutrition interventions warranted at this time. RD contact information provided. If additional nutrition issues arise, please re-consult RD.  Lajuan Lines, RD, LDN Clinical Nutrition After Hours/Weekend Pager # in [place]

## 2020-03-31 NOTE — Progress Notes (Signed)
PROGRESS NOTE                                                                                                                                                                                                             Patient Demographics:    Angelica Hogan, is a 59 y.o. female, DOB - Jan 17, 1961, AVW:[id number]  Admit date - 03/30/2020   Admitting Physician Mckinley Jewel, MD  Outpatient Primary MD for the patient is Rogers Blocker, MD  LOS - 1   Chief Complaint  Patient presents with   Hyperglycemia       Brief Narrative    HPI: Angelica Hogan is a 59 y.o. female with medical history significant of hypertension, right breast cancer status post radiation therapy in 2012 presents to emergency department with hyperglycemia and polyuria since 1 week.  Patient tells me that she had craving for soda.  She was drinking about 3 sodas per day.  About 3 to 4 days ago she switched and began drinking a whole lot of water.  She had excessive urination and was urinating almost about every 30 minutes or so.  This morning she started feeling fatigue and foggy.  She works in a cardiology office.  She asked the nurse who checked her blood sugar which was noted to be high multiple times therefore she was advised to go to the ER for further evaluation and management.  Has a strong family history of diabetes in Dad & aunt.  Reports blurry vision however denies headache, chest pain, lightheadedness, dizziness, shortness of breath, palpitation, nausea, vomiting, fever, chills, cough, congestion, abdominal pain, dysuria, hematuria, foul-smelling urine, back pain numbness weakness tingling sensation in hands or feet or ulcers or sores in feet.  Patient tells me that she has whitish non-foul-smelling vaginal discharge since 1 week.  She is not sexually active.  Denies previous history of STDs.  She denies itching or burning sensation.  She tells me that she is stressed about  as she has to take care of her dad and her mom has dementia.  She denies depressed mood.  No history of smoking, alcohol, illicit drug use.  ED Course: Upon arrival to ED: Patient blood pressure elevated, blood glucose: 752, afebrile with no leukocytosis.  CMP shows potassium of 3.4, AKI, anion gap 20, CO2 17.  CBC shows  microcytosis, UA positive for leukocytes and bacteria.  Serum osmolality: 317.  Beta hydroxybutyric acid, A1c, COVID-19 pending.  Chest x-ray negative for acute findings.  Patient started on insulin drip.  Potassium replaced in ED.  [facility] hospitalist consulted for admission for new onset DKA.    Subjective:    Angelica Hogan today has, No headache, No chest pain, No abdominal pain - No Nausea, No new weakness tingling or numbness, No Cough - SOB.    Assessment  & Plan :    Principal Problem:   DKA (diabetic ketoacidoses) ([place]) Active Problems:   Hyperglycemia   Hypokalemia   AKI (acute kidney injury) ([place])   High anion gap metabolic acidosis   Microcytosis   Breast cancer (H[facility])   Asymptomatic bacteriuria   Vaginal candidiasis    New onset DKA: -Patient presented with blood glucose of 752, CO2: 17, anion gap: 20, osmolality: 317. -Beta hydroxybutyric acid elevated at 5.4 on admission. -Her A1c is 11.4. -She is on insulin drip overnight, insulin drip protocol, anion gap has closed this morning, she was started on Lantus 10 units subcu daily, insulin sliding scale, and her insulin drip has been stopped, she is tolerating carb modified diet. -CBGs elevated, will give another 5 units and continue on 15 units Lantus from tomorrow, continue with sliding scale.  AKI: Likely secondary to dehydration -Improved with IV fluids  Hypokalemia: -Repleted  Hypertension:  -Blood pressure controlled, patient reports she has not been taking Hyzaar for a while, she was resumed on her home meds given AKI has resolved .  History of right breast cancer status post radiation  therapy in 2012: Aware  Asymptomatic bacteriuria: -Patient UA is positive for leukocytes and bacteria.  She denies any urinary symptoms.  She is afebrile with no leukocytosis -We will hold off antibiotics at this time  Vaginal candidiasis: -Patient reports whitish non-foul-smelling discharge since 1 week -UA is positive for yeast -Fluconazole 150 p.o. once.    COVID-19 Labs  Recent Labs    03/30/20 1753  FERRITIN 335*    Lab Results  Component Value Date   [place] NEGATIVE 03/30/2020     Code Status : Full  Family Communication  : None at bedside  Disposition Plan  :  Status is: Inpatient  Remains inpatient appropriate because:Inpatient level of care appropriate due to severity of illness   Dispo: The patient is from: Home              Anticipated d/c is to: Home              Anticipated d/c date is: 1 day              Patient currently is not medically stable to d/c. patient still need adjustment  Of her subcutaneous insulin regimen       Consults  :  None  Procedures  : None  DVT Prophylaxis  :  [place] lovenox  Lab Results  Component Value Date   PLT 184 03/30/2020    Antibiotics  :    Anti-infectives (From admission, onward)   Start     Dose/Rate Route Frequency Ordered Stop   03/30/20 1800  fluconazole (DIFLUCAN) tablet 150 mg        150 mg Oral  Once 03/30/20 1759 03/30/20 2122        Objective:   Vitals:   03/30/20 2308 03/31/20 0342 03/31/20 0800 03/31/20 1200  BP: 127/74 134/81 (!) 152/87 (!) 158/95  Pulse: 66 66 73  76  Resp: 20 17    Temp: 98.1 F (36.7 C) 98.3 F (36.8 C) 98.5 F (36.9 C) 98.7 F (37.1 C)  TempSrc: Oral Oral Oral Oral  SpO2: 97% 97% 98% 99%  Weight:  101.5 kg    Height:        Wt Readings from Last 3 Encounters:  03/31/20 101.5 kg  02/01/16 97.5 kg  07/29/15 97.1 kg     Intake/Output Summary (Last 24 hours) at 03/31/2020 1602 Last data filed at 03/31/2020 0400 Gross per 24 hour  Intake 879.99 ml    Output --  Net 879.99 ml     Physical Exam  Awake Alert, Oriented X 3, No new F.N deficits, Normal affect Symmetrical Chest wall movement, Good air movement bilaterally, CTAB RRR,No Gallops,Rubs or new Murmurs, No Parasternal Heave +ve B.Sounds, Abd Soft, No tenderness, No rebound - guarding or rigidity. No Cyanosis, Clubbing or edema, No new Rash or bruise      Data Review:    CBC Recent Labs  Lab 03/30/20 1456 03/30/20 1639 03/30/20 1753  WBC 8.8  --  8.2  HGB 12.8 14.6 12.7  HCT 41.5 43.0 41.1  PLT 197  --  184  MCV 78.3*  --  80.1  MCH 24.2*  --  24.8*  MCHC 30.8  --  30.9  RDW 14.4  --  14.3    Chemistries  Recent Labs  Lab 03/30/20 1456 03/30/20 1639 03/30/20 1753 03/30/20 2021 03/31/20 0215  NA 135 137 140 140 139  K 3.4* 3.4* 2.9* 3.3* 3.2*  CL 98  --  108 111 112*  CO2 17*  --  15* 16* 18*  GLUCOSE 752*  --  364* 249* 180*  BUN 11  --  8 9 6   CREATININE 1.31*  --  1.01* 0.80 0.67  CALCIUM 9.4  --  8.7* 8.3* 8.2*  MG  --   --  2.0  --   --    ------------------------------------------------------------------------------------------------------------------ No results for input(s): CHOL, HDL, LDLCALC, TRIG, CHOLHDL, LDLDIRECT in the last 72 hours.  Lab Results  Component Value Date   HGBA1C 11.5 (H) 03/30/2020   ------------------------------------------------------------------------------------------------------------------ No results for input(s): TSH, T4TOTAL, T3FREE, THYROIDAB in the last 72 hours.  Invalid input(s): FREET3 ------------------------------------------------------------------------------------------------------------------ Recent Labs    03/30/20 1753  FERRITIN 335*  TIBC 274  IRON 37    Coagulation profile No results for input(s): INR, PROTIME in the last 168 hours.  No results for input(s): DDIMER in the last 72 hours.  Cardiac Enzymes No results for input(s): CKMB, TROPONINI, MYOGLOBIN in the last 168  hours.  Invalid input(s): CK ------------------------------------------------------------------------------------------------------------------ No results found for: BNP  Inpatient Medications  Scheduled Meds:  enoxaparin (LOVENOX) injection  40 mg Subcutaneous Q24H   insulin aspart  0-5 Units Subcutaneous QHS   insulin aspart  0-9 Units Subcutaneous TID WC   insulin glargine  10 Units Subcutaneous Daily   Continuous Infusions:  sodium chloride Stopped (03/30/20 1954)   dextrose 5 % and 0.45% NaCl 75 mL/hr at 03/31/20 0607   PRN Meds:.dextrose, hydrALAZINE  Micro Results Recent Results (from the past 240 hour(s))  SARS Coronavirus 2 by RT PCR (hospital order, performed in [facility] hospital lab) Nasopharyngeal Nasopharyngeal Swab     Status: None   Collection Time: 03/30/20  4:52 PM   Specimen: Nasopharyngeal Swab  Result Value Ref Range Status   SARS Coronavirus 2 NEGATIVE NEGATIVE Final    Comment: (NOTE) SARS-CoV-2 target nucleic acids  are NOT DETECTED.  The SARS-CoV-2 RNA is generally detectable in upper and lower respiratory specimens during the acute phase of infection. The lowest concentration of SARS-CoV-2 viral copies this assay can detect is 250 copies / mL. A negative result does not preclude SARS-CoV-2 infection and should not be used as the sole basis for treatment or other patient management decisions.  A negative result may occur with improper specimen collection / handling, submission of specimen other than nasopharyngeal swab, presence of viral mutation(s) within the areas targeted by this assay, and inadequate number of viral copies (<250 copies / mL). A negative result must be combined with clinical observations, patient history, and epidemiological information.  Fact Sheet for Patients:   StrictlyIdeas.no  Fact Sheet for Healthcare Providers: BankingDealers.co.za  This test is not yet approved or   cleared by the [place] FDA and has been authorized for detection and/or diagnosis of SARS-CoV-2 by FDA under an Emergency Use Authorization (EUA).  This EUA will remain in effect (meaning this test can be used) for the duration of the COVID-19 declaration under Section 564(b)(1) of the Act, 21 U.S.C. section 360bbb-3(b)(1), unless the authorization is terminated or revoked sooner.  Performed at [facility], [place] [address] [place], [place] [address]     Radiology Reports DG Chest Portable 1 View  Result Date: 03/30/2020 CLINICAL DATA:  DKA EXAM: PORTABLE CHEST 1 VIEW COMPARISON:  06/10/2011 FINDINGS: Heart and mediastinal contours are within normal limits. No focal opacities or effusions. No acute bony abnormality. IMPRESSION: No active disease. Electronically Signed   By: Rolm Baptise M.D.   On: 03/30/2020 17:40     Phillips Climes M.D on 03/31/2020 at 4:02 PM    [facility] -  Office  [phone]

## 2020-04-01 LAB — BASIC METABOLIC PANEL
Anion gap: 11 (ref 5–15)
BUN: 7 mg/dL (ref 6–20)
CO2: 18 mmol/L — ABNORMAL LOW (ref 22–32)
Calcium: 8.5 mg/dL — ABNORMAL LOW (ref 8.9–10.3)
Chloride: 105 mmol/L (ref 98–111)
Creatinine, Ser: 0.83 mg/dL (ref 0.44–1.00)
GFR calc Af Amer: >60 mL/min (ref >60)
GFR calc non Af Amer: >60 mL/min (ref >60)
Glucose, Bld: 319 mg/dL — ABNORMAL HIGH (ref 70–99)
Potassium: 3.6 mmol/L (ref 3.5–5.1)
Sodium: 134 mmol/L — ABNORMAL LOW (ref 135–145)

## 2020-04-01 LAB — CBC
HCT: 39.8 % (ref 36.0–46.0)
Hemoglobin: 12.7 g/dL (ref 12.0–15.0)
MCH: 25 pg — ABNORMAL LOW (ref 26.0–34.0)
MCHC: 31.9 g/dL (ref 30.0–36.0)
MCV: 78.3 fL — ABNORMAL LOW (ref 80.0–100.0)
Platelets: 190 10*3/uL (ref 150–400)
RBC: 5.08 MIL/uL (ref 3.87–5.11)
RDW: 14.5 % (ref 11.5–15.5)
WBC: 6.6 10*3/uL (ref 4.0–10.5)
nRBC: 0 % (ref 0.0–0.2)

## 2020-04-01 LAB — GLUCOSE, CAPILLARY
Glucose-Capillary: 292 mg/dL — ABNORMAL HIGH (ref 70–99)
Glucose-Capillary: 245 mg/dL — ABNORMAL HIGH (ref 70–99)

## 2020-04-01 MED ORDER — BLOOD GLUCOSE METER KIT: PACK | 0 refills | Status: AC

## 2020-04-01 MED ORDER — LIVING WELL WITH DIABETES BOOK
Freq: Once | Status: AC
  Filled 2020-04-01: qty 1

## 2020-04-01 MED ORDER — LEVEMIR FLEXTOUCH 100 UNIT/ML ~~LOC~~ SOPN: 20.0000 [IU] | PEN_INJECTOR | Freq: Every day | SUBCUTANEOUS | 3 refills | Status: AC

## 2020-04-01 MED ORDER — LOSARTAN POTASSIUM-HCTZ 100-25 MG PO TABS: 1.0000 | ORAL_TABLET | Freq: Every day | ORAL | 0 refills | Status: AC

## 2020-04-01 MED ORDER — INSULIN PEN NEEDLE 32G X 4 MM MISC: 0 refills | Status: AC

## 2020-04-01 MED ORDER — INSULIN GLARGINE 100 UNIT/ML ~~LOC~~ SOLN
20.0000 [IU] | Freq: Every day | SUBCUTANEOUS | Status: DC
  Administered 2020-04-01: 20 [IU] via SUBCUTANEOUS
  Filled 2020-04-01: qty 0.2

## 2020-04-01 NOTE — Progress Notes (Signed)
Inpatient Diabetes Program Recommendations  AACE/ADA: New Consensus Statement on Inpatient Glycemic Control (2015)  Target Ranges:  Prepandial:   less than 140 mg/dL      Peak postprandial:   less than 180 mg/dL (1-2 hours)      Critically ill patients:  140 - 180 mg/dL   Lab Results  Component Value Date   GLUCAP 292 (H) 04/01/2020   HGBA1C 11.5 (H) 03/30/2020    Review of Glycemic Control Results for Angelica Hogan, Angelica Hogan (MRN [id number]) as of 04/01/2020 11:32  Ref. Range 03/31/2020 11:51 03/31/2020 17:06 03/31/2020 21:21 04/01/2020 07:54  Glucose-Capillary Latest Ref Range: 70 - 99 mg/dL 181 (H) 277 (H) 341 (H) 292 (H)    Note:  Spoke with patient at length on the phone about new diagnosis. Discussed A1C results with her and explained what an A1C is, basic pathophysiology of DM Type 2, basic home care, basic diabetes diet nutrition principles, importance of checking CBGs and maintaining good CBG control to prevent long-term and short-term complications. Reviewed signs and symptoms of hyperglycemia and hypoglycemia and how to treat hypoglycemia at home. Also reviewed blood sugar goals at home.  RNs to provide ongoing basic DM education at bedside with this patient. Have ordered educational booklet, insulin starter kit, and DM videos. Have also placed RD consult for DM diet education for this patient.   Educated patient on insulin pen use at home. Reviewed contents of insulin flexpen starter kit. Reviewed all steps of insulin pen including attachment of needle, 2-unit air shot, dialing up dose, giving injection, removing needle, disposal of sharps, storage of unused insulin, disposal of insulin etc. Patient able to provide successful return demonstration. Also reviewed troubleshooting with insulin pen. MD to give patient Rxs for insulin pens and insulin pen needles.  Glucometer #[id number] Levemir flex pen [id number] Insulin pen needles # [id number]  Drinks a lot of regular sodas, juices and milk.  Educated  pt. on CHO's and goal CHO's of 50 per meal.  She states she will switch to diet drinks and water.  Discussed The Plate Method and importance of exercise.  Educated her on hypoglycemia, symptoms and treatment.  Asked her to monitor CBG's prior to breakfast and qhs.  If <100 mg/dl at bedtime, have a 15 gram CHO snack.  If consisently <100 or >200 mg/dl call PCP.  TOC is setting pt up with PCP as she does not currently have one.  Recommend PCP appt. For in 1 week to review CBG's.  She is aware to bring meter to appointment.    Will continue to follow while inpatient.  Thank you, Reche Dixon, RN, BSN Diabetes Coordinator Inpatient Diabetes Program [phone] (team pager from 8a-5p)

## 2020-04-01 NOTE — Discharge Instructions (Signed)
Follow with Primary MD in 7 days   Get CBC, CMP, checked  by Primary MD next visit.    Activity: As tolerated with Full fall precautions use walker/cane & assistance as needed   Disposition Home    Diet: Heart Healthy /Carb modified  .  On your next visit with your primary care physician please Get Medicines reviewed and adjusted.   Please request your Prim.MD to go over all Hospital Tests and Procedure/Radiological results at the follow up, please get all Hospital records sent to your Prim MD by signing hospital release before you go home.   If you experience worsening of your admission symptoms, develop shortness of breath, life threatening emergency, suicidal or homicidal thoughts you must seek medical attention immediately by calling 911 or calling your MD immediately  if symptoms less severe.  You Must read complete instructions/literature along with all the possible adverse reactions/side effects for all the Medicines you take and that have been prescribed to you. Take any new Medicines after you have completely understood and accpet all the possible adverse reactions/side effects.   Do not drive, operating heavy machinery, perform activities at heights, swimming or participation in water activities or provide baby sitting services if your were admitted for syncope or siezures until you have seen by Primary MD or a Neurologist and advised to do so again.  Do not drive when taking Pain medications.    Do not take more than prescribed Pain, Sleep and Anxiety Medications  Special Instructions: If you have smoked or chewed Tobacco  in the last 2 yrs please stop smoking, stop any regular Alcohol  and or any Recreational drug use.  Wear Seat belts while driving.   Please note  You were cared for by a hospitalist during your hospital stay. If you have any questions about your discharge medications or the care you received while you were in the hospital after you are discharged,  you can call the unit and asked to speak with the hospitalist on call if the hospitalist that took care of you is not available. Once you are discharged, your primary care physician will handle any further medical issues. Please note that NO REFILLS for any discharge medications will be authorized once you are discharged, as it is imperative that you return to your primary care physician (or establish a relationship with a primary care physician if you do not have one) for your aftercare needs so that they can reassess your need for medications and monitor your lab values.

## 2020-04-01 NOTE — Progress Notes (Signed)
Inpatient Diabetes Program Recommendations  AACE/ADA: New Consensus Statement on Inpatient Glycemic Control (2015)  Target Ranges:  Prepandial:   less than 140 mg/dL      Peak postprandial:   less than 180 mg/dL (1-2 hours)      Critically ill patients:  140 - 180 mg/dL   Lab Results  Component Value Date   GLUCAP 292 (H) 04/01/2020   HGBA1C 11.5 (H) 03/30/2020    Review of Glycemic Control Results for Angelica Hogan, Angelica Hogan (MRN [id number]) as of 04/01/2020 07:57  Ref. Range 03/31/2020 17:06 03/31/2020 21:21 04/01/2020 07:54  Glucose-Capillary Latest Ref Range: 70 - 99 mg/dL 277 (H) 341 (H) 292 (H)   Diabetes history: Pre-diabetes  Outpatient Diabetes medications:  None  Current orders for Inpatient glycemic control:  Lantus 15 units daily  Novolog 0-9 tid Novolog 0-5 qhs  Inpatient Diabetes Program Recommendations:    Lantus 20 units daily (0.2 units/kg) Novolog 0-15 tid with meals   Will continue to follow while inpatient.  Thank you, Reche Dixon, RN, BSN Diabetes Coordinator Inpatient Diabetes Program [phone] (team pager from 8a-5p)

## 2020-04-01 NOTE — Care Management (Signed)
Spoke w patient. Patient is currently listed with Dr Marlou Sa as PCP. She states that she wants to ask a coworker who they use for their PCP and change practices.  CM advised her to this as soon as possible because she will need an MD for refills on her medications. Offered to schedule w [place], declined. [facility]ed [facility], as an employee she would have benefits to lower costs. Discussed [facility] resources for DM, and referral placed to [facility].  No other CM needs identified at this time.

## 2020-04-01 NOTE — Discharge Summary (Signed)
Angelica Hogan, is a 59 y.o. female  DOB 1961/09/25  MRN [id number].  Admission date:  03/30/2020  Admitting Physician  Mckinley Jewel, MD  Discharge Date:  04/01/2020   Primary MD  Rogers Blocker, MD  Recommendations for primary care physician for things to follow:  - please check CBC, CMP during next visit. -Please adjust her insulin dose as needed. -Adjust  blood pressure regimen as needed   Admission Diagnosis  DKA (diabetic ketoacidoses) ([place]) [E11.10]   Discharge Diagnosis  DKA (diabetic ketoacidoses) ([place]) [E11.10]    Principal Problem:   DKA (diabetic ketoacidoses) ([place]) Active Problems:   Hyperglycemia   Hypokalemia   AKI (acute kidney injury) ([place])   High anion gap metabolic acidosis   Microcytosis   Breast cancer ([place])   Asymptomatic bacteriuria   Vaginal candidiasis      Past Medical History:  Diagnosis Date  . 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 Fibroids   . Malignant neoplasm of central portion of female breast ([place]) 08/18/2011    Past Surgical History:  Procedure Laterality Date  . BREAST LUMPECTOMY  06/12/2011   rt breast  . HERNIA REPAIR  2010  . NASAL SINUS SURGERY     1980's       History of present illness and  Hospital Course:     Kindly see H&P for history of present illness and admission details, please review complete Labs, Consult reports and Test reports for all details in brief  HPI  from the history and physical done on the day of admission 11/02/4008   HPI: Angelica Hogan is a 59 y.o. female with medical history significant of hypertension, right breast cancer status post radiation therapy in 2012 presents to emergency department with hyperglycemia and polyuria since 1 week.  Patient tells me that she had craving for soda.  She was drinking about 3 sodas per day.  About 3 to 4 days ago she switched  and began drinking a whole lot of water.  She had excessive urination and was urinating almost about every 30 minutes or so.  This morning she started feeling fatigue and foggy.  She works in a cardiology office.  She asked the nurse who checked her blood sugar which was noted to be high multiple times therefore she was advised to go to the ER for further evaluation and management.  Has a strong family history of diabetes in Dad & aunt.  Reports blurry vision however denies headache, chest pain, lightheadedness, dizziness, shortness of breath, palpitation, nausea, vomiting, fever, chills, cough, congestion, abdominal pain, dysuria, hematuria, foul-smelling urine, back pain numbness weakness tingling sensation in hands or feet or ulcers or sores in feet.  Patient tells me that she has whitish non-foul-smelling vaginal discharge since 1 week.  She is not sexually active.  Denies previous history of STDs.  She denies itching or burning sensation.  She tells me that she is stressed about  as she has to take care of her dad and her mom has dementia.  She denies depressed mood.  No history of smoking, alcohol, illicit drug use.  ED Course: Upon arrival to ED: Patient blood pressure elevated, blood glucose: 752, afebrile with no leukocytosis.  CMP shows potassium of 3.4, AKI, anion gap 20, CO2 17.  CBC shows microcytosis, UA positive for leukocytes and bacteria.  Serum osmolality: 317.  Beta hydroxybutyric acid, A1c, COVID-19 pending.  Chest x-ray negative for acute findings.  Patient started on insulin drip.  Potassium replaced in ED.  [facility] hospitalist consulted for admission for new onset DKA.  Hospital Course    New onset diabetes mellitus with DKA on admission : -Patient presented with blood glucose of 752, CO2: 17, anion gap: 20, osmolality: 317. -Beta hydroxybutyric acid elevated at 5.4 on admission. -Her A1c is 11.4. -Patient was started on insulin drip initially, her anion gap has closed,  so she has been transitioned to subcu insulin, she was on continuous of Lantus yesterday, with overall poor control, so this has been adjusted to 20 units subcu daily, will be discharged on this dose, patient was educated by staff how to inject insulin, how to check her blood pressure, she was consulted by diabetic coordinator as well . -Patient currently with no PCP, case manager were consulted to provide assistance with establishing with PCP, and they will contact [facility] to [facility] .  AKI: Likely secondary to dehydration -Resolved with hydration  Hypokalemia: -Repleted  Hypertension:  -Blood pressure uncontrolled, patient supposed to be on Hyzaar at home, but she has not been taking it for a while as she has not been following with PCP, now had AKI has improved, and blood pressure is elevated, she was resumed on it .Marland Kitchen  History of right breast cancer status post radiation therapy in 2012  Asymptomatic bacteriuria: -Patient UA is positive for leukocytes and bacteria. She denies any urinary symptoms. She is afebrile with no leukocytosis, no indication to treat  Vaginal candidiasis: -Patient reports whitish non-foul-smelling discharge since 1 week -UA is positive for yeast -Fluconazole 150 p.o. once.  Discharge Condition:  Stable   Follow UP  To follow with PCP, case management will provide a list of available PCPs in the community   Discharge Instructions  and  Discharge Medications   Discharge Instructions    Discharge instructions   Complete by: As directed    Follow with Primary MD in 7 days   Get CBC, CMP, checked  by Primary MD next visit.    Activity: As tolerated with Full fall precautions use walker/cane & assistance as needed   Disposition Home    Diet: Heart Healthy /Carb modified  .  On your next visit with your primary care physician please Get Medicines reviewed and adjusted.   Please request your Prim.MD to go over all Hospital Tests and  Procedure/Radiological results at the follow up, please get all Hospital records sent to your Prim MD by signing hospital release before you go home.   If you experience worsening of your admission symptoms, develop shortness of breath, life threatening emergency, suicidal or homicidal thoughts you must seek medical attention immediately by calling 911 or calling your MD immediately  if symptoms less severe.  You Must read complete instructions/literature along with all the possible adverse reactions/side effects for all the Medicines you take and that have been prescribed to you. Take any new Medicines after you have completely understood and accpet all the possible adverse  reactions/side effects.   Do not drive, operating heavy machinery, perform activities at heights, swimming or participation in water activities or provide baby sitting services if your were admitted for syncope or siezures until you have seen by Primary MD or a Neurologist and advised to do so again.  Do not drive when taking Pain medications.    Do not take more than prescribed Pain, Sleep and Anxiety Medications  Special Instructions: If you have smoked or chewed Tobacco  in the last 2 yrs please stop smoking, stop any regular Alcohol  and or any Recreational drug use.  Wear Seat belts while driving.   Please note  You were cared for by a hospitalist during your hospital stay. If you have any questions about your discharge medications or the care you received while you were in the hospital after you are discharged, you can call the unit and asked to speak with the hospitalist on call if the hospitalist that took care of you is not available. Once you are discharged, your primary care physician will handle any further medical issues. Please note that NO REFILLS for any discharge medications will be authorized once you are discharged, as it is imperative that you return to your primary care physician (or establish a  relationship with a primary care physician if you do not have one) for your aftercare needs so that they can reassess your need for medications and monitor your lab values.   Increase activity slowly   Complete by: As directed      Allergies as of 04/01/2020   No Known Allergies     Medication List    TAKE these medications   acetaminophen 325 MG tablet Commonly known as: TYLENOL Take 650 mg by mouth every 6 (six) hours as needed for moderate pain.   Biotin 10 MG Tabs Take 1 tablet by mouth daily.   blood glucose meter kit and supplies Dispense based on patient and insurance preference. Use up to four times daily as directed. (FOR ICD-10 E10.9, E11.9).   Insulin Pen Needle 32G X 4 MM Misc Please use with insulin   Levemir FlexTouch 100 UNIT/ML FlexPen Generic drug: insulin detemir Inject 20 Units into the skin daily.   losartan-hydrochlorothiazide 100-25 MG tablet Commonly known as: HYZAAR Take 1 tablet by mouth daily.   One-A-Day Womens Mind & Body Tabs Take 1 tablet by mouth daily.   Eye Vitamins & Minerals Tabs Take 1 tablet by mouth daily.   PROBIOTIC DAILY PO Take 1 capsule by mouth daily.   Vitamin D (Cholecalciferol) 25 MCG (1000 UT) Tabs Take 1 tablet by mouth once a week.         Diet and Activity recommendation: See Discharge Instructions above   Consults obtained -  none   Major procedures and Radiology Reports - PLEASE review detailed and final reports for all details, in brief -      DG Chest Portable 1 View  Result Date: 03/30/2020 CLINICAL DATA:  DKA EXAM: PORTABLE CHEST 1 VIEW COMPARISON:  06/10/2011 FINDINGS: Heart and mediastinal contours are within normal limits. No focal opacities or effusions. No acute bony abnormality. IMPRESSION: No active disease. Electronically Signed   By: Rolm Baptise M.D.   On: 03/30/2020 17:40    Micro Results     Recent Results (from the past 240 hour(s))  SARS Coronavirus 2 by RT PCR (hospital order,  performed in [facility] hospital lab) Nasopharyngeal Nasopharyngeal Swab     Status: None  Collection Time: 03/30/20  4:52 PM   Specimen: Nasopharyngeal Swab  Result Value Ref Range Status   SARS Coronavirus 2 NEGATIVE NEGATIVE Final    Comment: (NOTE) SARS-CoV-2 target nucleic acids are NOT DETECTED.  The SARS-CoV-2 RNA is generally detectable in upper and lower respiratory specimens during the acute phase of infection. The lowest concentration of SARS-CoV-2 viral copies this assay can detect is 250 copies / mL. A negative result does not preclude SARS-CoV-2 infection and should not be used as the sole basis for treatment or other patient management decisions.  A negative result may occur with improper specimen collection / handling, submission of specimen other than nasopharyngeal swab, presence of viral mutation(s) within the areas targeted by this assay, and inadequate number of viral copies (<250 copies / mL). A negative result must be combined with clinical observations, patient history, and epidemiological information.  Fact Sheet for Patients:   StrictlyIdeas.no  Fact Sheet for Healthcare Providers: BankingDealers.co.za  This test is not yet approved or  cleared by the [place] FDA and has been authorized for detection and/or diagnosis of SARS-CoV-2 by FDA under an Emergency Use Authorization (EUA).  This EUA will remain in effect (meaning this test can be used) for the duration of the COVID-19 declaration under Section 564(b)(1) of the Act, 21 U.S.C. section 360bbb-3(b)(1), unless the authorization is terminated or revoked sooner.  Performed at [facility], [place] [address], [place], [place] [address]        Today   Subjective:   Angelica Hogan today has no headache,no chest abdominal pain,no new weakness tingling or numbness, feels much better wants to go home today.   Objective:   Blood pressure (!)  165/100, pulse 88, temperature 98.6 F (37 C), temperature source Oral, resp. rate 20, height '5\' 5"'$  (1.651 m), weight 102.2 kg, SpO2 99 %.   Intake/Output Summary (Last 24 hours) at 04/01/2020 1119 Last data filed at 04/01/2020 0900 Gross per 24 hour  Intake 784.04 ml  Output 2900 ml  Net -2115.96 ml    Exam Awake Alert, Oriented x 3, No new F.N deficits, Normal affect Symmetrical Chest wall movement, Good air movement bilaterally, CTAB RRR,No Gallops,Rubs or new Murmurs, No Parasternal Heave +ve B.Sounds, Abd Soft, Non tender,  No rebound -guarding or rigidity. No Cyanosis, Clubbing or edema, No new Rash or bruise  Data Review   CBC w Diff:  Lab Results  Component Value Date   WBC 6.6 04/01/2020   HGB 12.7 04/01/2020   HGB 11.3 (L) 11/14/2011   HCT 39.8 04/01/2020   HCT 34.4 (L) 11/14/2011   PLT 190 04/01/2020   PLT 193 11/14/2011   LYMPHOPCT 36.2 11/14/2011   MONOPCT 9.4 11/14/2011   EOSPCT 1.6 11/14/2011   BASOPCT 0.3 11/14/2011    CMP:  Lab Results  Component Value Date   NA 134 (L) 04/01/2020   K 3.6 04/01/2020   CL 105 04/01/2020   CO2 18 (L) 04/01/2020   BUN 7 04/01/2020   CREATININE 0.83 04/01/2020   PROT 7.0 11/14/2011   ALBUMIN 4.4 11/14/2011   BILITOT 0.2 (L) 11/14/2011   ALKPHOS 57 11/14/2011   AST 17 11/14/2011   ALT 17 11/14/2011  .   Total Time in preparing paper work, data evaluation and todays exam - 19 minutes  Phillips Climes M.D on 04/01/2020 at [place]  [phone]

## 2020-04-03 ENCOUNTER — Encounter: Payer: Self-pay | Admitting: *Deleted

## 2020-04-03 ENCOUNTER — Other Ambulatory Visit: Payer: Self-pay | Admitting: *Deleted

## 2020-04-03 NOTE — Patient Outreach (Signed)
[facility]) Care Management  06/01/7901  Angelica Hogan [birth year] [id number]   Transition of care call  Referral received:04/03/20 Initial outreach: 04/03/20 Insurance: [place] [employer]MR    Subjective: Initial successful telephone call to patient's preferred number in order to complete transition of care assessment; 2 HIPAA identifiers verified. Explained purpose of call and completed transition of care assessment.  Angelica Hogan states that she is "making it" . She reports being a little weak and fuzzy since discharge. She discussed frustration with her herself for not realizing something was going on with her, feeling so tired, going to bathroom more frequently. She discussed having prior history of prediabetes but has not been on any medications. She discussed having some difficulty with choosing what to eat, states they have taken away all my food. Discussed adjustment in diet to help with controlling blood sugars, she states she understands but just trying to get used to it. She denies having nausea vomiting.   Patient lives at home alone, she has a son that comes to visit and assist as needed, but discussed he gets on her nerves at times.  Patient discussed having blood sugar meter, [facility] prescription [facility], discussed cost saving with [facility] when enrolled in Active health management disease management program, she voiced understanding but was discharged on Holiday.  Patient discussed having low blood sugar episode on she recall 7/4, down to 60, she reports taking treating with juice and blood sugar improved she could not recall 2nd reading. Patient discussed blood sugar earlier today over 300 . Discussed with patient  insulin orders patient states that she took 2 units, in reviewing discharge instruction reviewed with patient Levemir 20 units daily prescribed. She states that she must have gotten confused, thinking about the 2 unit air shot.  Patient able to get her  insulin pen out for review over telephone  and able to dial up the prescribed amount of insulin to administer daily. Patient reports focusing on limiting carbohydrate intake, educated on plate method for balanced meal  reviewed reading label for carbohydrates, reviewed inpatient Diabetes coordinator recommendation of 50 per meal. Reinforced with patient monitoring blood sugars at least before breakfast and at bedtime. Reviewed blood sugar that MD needs to be notified of consistently greater than 200. Reviewed elevated and low blood sugars to seek medical attention for . Patient report water and green tea, on review of label it is sweet drink not diet. Reinforced with patient to drink water or diet soda with meal on this evening. She discussed giving away her juices and sweets in the home.   She ongoing health issues of Diabetes and Hypertension  and says that she is agreeable to one of the  chronic disease management programs. Patient discussed having Dr. Marlou Sa as PCP , wants to change provider and she was able to leave a message at [facility] regarding establishing with practice. Discussed with patient need to have PCP for follow up care. Discussed making contact with [facility] for appointment, patient states this was mentioned to her in hospital but she declined but if she doesn't head from [facility] office on tomorrow she will reconsider.     Objective:  Angelica Hogan  was hospitalized at [facility]  From 7/2-04/01/20 Diabetes Ketoacidosis ,  Comorbidities include: Pre Diabetes, Breast Cancer  She was discharged to home on 04/01/20 /without the need for home health services or DME.   Assessment:  Patient voices  good understanding of all discharge instructions.  See transition of care flowsheet for assessment details.   Plan:  Reviewed hospital discharge diagnosis of Diabetes Ketoacidosis   and discharge treatment plan using hospital  discharge instructions, assessing medication adherence, reviewing problems requiring provider notification, and discussing the importance of follow up with primary care provider and/or specialists as directed.  Reviewed [place] healthy lifestyle program information to receive discounted premium for  2022   Step 1: Get  your annual physical  Step 2: Complete your health assessment  Step 3:Identify your current health status and complete the corresponding action step between January 1, and May 30, 2020.    Using Active Health Management ActiveAdvice View website,enrolled patient to  participate in [place]'s Active Health Management chronic disease management program.    Will provide return call to patient in the next 2 business day regarding PCP office visit and blood sugars.  Will route successful outreach letter with [place] Management pamphlet and 24 Hour Nurse Line Magnet to [place] Management clinical pool to be mailed to patient's home address.  Thanked patient for their services to [facility].  Joylene Draft, RN, BSN  [place] Management Coordinator  [phone]- Mobile [phone]- Toll Free Main Office

## 2020-04-04 ENCOUNTER — Other Ambulatory Visit: Payer: Self-pay | Admitting: *Deleted

## 2020-04-04 NOTE — Patient Outreach (Signed)
Angelica Hogan) Care Management  02/02/3892  [name] [birth year] [id number]   Transition of care call  Referral received:04/03/20 Initial outreach: 04/03/20 Insurance: [place]   Subjective Successful follow up call to patient she discussed feeling better on today.  She discussed being able to take Levemir 20 units as prescribed on today, with blood sugar at 432 earlier on today.She is able to review steps of giving insulin with pen.  Reviewed with patient importance in notifying MD of consistent  elevated blood sugars for adjustments in plan. She states being told it will take a little time for blood sugars to come down, as she have been elevated for a while .   Patient discussed that she continues to make calls regarding seeing new provider, she requested assistance with contacting [place] [facility] for appointment . Placed call to center 1st available post discharge appointment is August 3, per representative. Ask patient to consider follow up with Dr. Marlou Sa post discharge for sooner visit and then plan transition she states not at this time .    Received return call from patient that she has scheduled an appointment with Dr. Marlou Sa for Friday 04/06/20. Reinforced with patient to ask about referral to Nutrition and Diabetes education center.   Objective: [name] was hospitalized [facility]  From 7/2-04/01/20 Diabetes Ketoacidosis ,  Comorbidities include: Pre Diabetes, Breast Cancer  She was discharged to home on 04/01/20 /without the need for home health servicesor DME.   Plan Will plan return call to patient in the next week to assess for ongoing care coordination needs.    Joylene Draft, RN, BSN  [place] Management Coordinator  [phone]- Mobile [phone]- Toll Free Main Office

## 2020-04-06 DIAGNOSIS — R6889 Other general symptoms and signs: Secondary | ICD-10-CM | POA: Diagnosis not present

## 2020-04-06 DIAGNOSIS — I10 Essential (primary) hypertension: Secondary | ICD-10-CM | POA: Diagnosis not present

## 2020-04-06 DIAGNOSIS — B373 Candidiasis of vulva and vagina: Secondary | ICD-10-CM | POA: Diagnosis not present

## 2020-04-06 DIAGNOSIS — Z853 Personal history of malignant neoplasm of breast: Secondary | ICD-10-CM | POA: Diagnosis not present

## 2020-04-06 DIAGNOSIS — N183 Chronic kidney disease, stage 3 unspecified: Secondary | ICD-10-CM | POA: Diagnosis not present

## 2020-04-06 DIAGNOSIS — Z13220 Encounter for screening for lipoid disorders: Secondary | ICD-10-CM | POA: Diagnosis not present

## 2020-04-06 DIAGNOSIS — E1165 Type 2 diabetes mellitus with hyperglycemia: Secondary | ICD-10-CM | POA: Diagnosis not present

## 2020-04-06 DIAGNOSIS — Z6834 Body mass index (BMI) 34.0-34.9, adult: Secondary | ICD-10-CM | POA: Diagnosis not present

## 2020-04-06 DIAGNOSIS — Z733 Stress, not elsewhere classified: Secondary | ICD-10-CM | POA: Diagnosis not present

## 2020-04-11 ENCOUNTER — Other Ambulatory Visit: Payer: Self-pay | Admitting: *Deleted

## 2020-04-11 NOTE — Patient Outreach (Signed)
[facility]) Care Management  2/52/4799  MAJORIE SANTEE [birth year] [id number]   Transition of care call/follow up call   Referral received:04/03/20 Initial outreach:04/03/20 [facility]: [facility]    Subjective: Unsuccessful follow up  call to patient, no answer able to a HIPAA compliant message for return call.  [name] Diabetes Ketoacidosis ,Comorbidities include: Pre Diabetes, Breast Cancer  Shewas discharged to home on7/4/21 /without the need for home health servicesor DME.  Plan Will plan return call to patient  in the next 4 business days, for 2nd follow up attempt.      Joylene Draft, RN, BSN  [place] Management Coordinator  [phone]- Mobile [phone]- Toll Free Main Office

## 2020-04-13 DIAGNOSIS — J029 Acute pharyngitis, unspecified: Secondary | ICD-10-CM | POA: Diagnosis not present

## 2020-04-13 DIAGNOSIS — E1165 Type 2 diabetes mellitus with hyperglycemia: Secondary | ICD-10-CM | POA: Diagnosis not present

## 2020-04-13 DIAGNOSIS — Z6834 Body mass index (BMI) 34.0-34.9, adult: Secondary | ICD-10-CM | POA: Diagnosis not present

## 2020-04-13 DIAGNOSIS — R6889 Other general symptoms and signs: Secondary | ICD-10-CM | POA: Diagnosis not present

## 2020-04-13 DIAGNOSIS — E785 Hyperlipidemia, unspecified: Secondary | ICD-10-CM | POA: Diagnosis not present

## 2020-04-13 DIAGNOSIS — H6691 Otitis media, unspecified, right ear: Secondary | ICD-10-CM | POA: Diagnosis not present

## 2020-04-13 DIAGNOSIS — I10 Essential (primary) hypertension: Secondary | ICD-10-CM | POA: Diagnosis not present

## 2020-04-13 DIAGNOSIS — Z733 Stress, not elsewhere classified: Secondary | ICD-10-CM | POA: Diagnosis not present

## 2020-04-13 DIAGNOSIS — B373 Candidiasis of vulva and vagina: Secondary | ICD-10-CM | POA: Diagnosis not present

## 2020-04-16 ENCOUNTER — Other Ambulatory Visit: Payer: Self-pay | Admitting: *Deleted

## 2020-04-16 NOTE — Patient Outreach (Signed)
[facility]) Care Management  2/77/8242  ILISSA ROSNER [birth year] [id number]   Transition of care call/follow up call   Referral received:04/03/20 Initial outreach:04/03/20 Insurance: [facility]    Subjective: Unsuccessful follow up  call to patient, no answer able to a HIPAA compliant message for return call.  [name] Diabetes Ketoacidosis ,Comorbidities include: Pre Diabetes, Breast Cancer  Shewas discharged to home on7/4/21 /without the need for home health servicesor DME.   Plan Unsuccessful follow up call attempt since initial successful outreach on 04/03/20 and 04/04/20  will plan case closure on day 10 if no return call.    Joylene Draft, RN, BSN  [place] Management Coordinator  [phone]- Mobile [phone]- Toll Free Main Office

## 2020-04-18 ENCOUNTER — Other Ambulatory Visit: Payer: Self-pay | Admitting: *Deleted

## 2020-04-18 NOTE — Patient Outreach (Addendum)
[facility]) Care Management  0/37/9558  Angelica Hogan [birth year] [id number]   Transition of care /Case Closure    Referral received:04/03/20 Initial outreach:04/03/20 Insurance: UMR    Subjective: Unsuccessful transition of care follow up call regarding Diabetes follow up care.   [name] Diabetes Ketoacidosis ,Comorbidities include: Pre Diabetes, Breast Cancer  Shewas discharged to home on7/4/21 /without the need for home health servicesor DME. Plan Case closed to [place] care management services unable to outreach for follow up call.  Patient has been enrolled in Active health management chronic disease program as she agreed.   Joylene Draft, RN, BSN  [place] Management Coordinator  [phone]- Mobile [phone]- Toll Free Main Office

## 2020-05-28 ENCOUNTER — Ambulatory Visit: Payer: 59 | Admitting: Family Medicine

## 2020-05-31 MED FILL — FREESTYLE LITE TEST STRIP: 33 days supply | Qty: 100 | Fill #0

## 2020-06-01 ENCOUNTER — Other Ambulatory Visit (HOSPITAL_COMMUNITY): Payer: Self-pay | Admitting: Internal Medicine

## 2020-06-01 DIAGNOSIS — Z6834 Body mass index (BMI) 34.0-34.9, adult: Secondary | ICD-10-CM | POA: Diagnosis not present

## 2020-06-01 DIAGNOSIS — Z733 Stress, not elsewhere classified: Secondary | ICD-10-CM | POA: Diagnosis not present

## 2020-06-01 DIAGNOSIS — N183 Chronic kidney disease, stage 3 unspecified: Secondary | ICD-10-CM | POA: Diagnosis not present

## 2020-06-01 DIAGNOSIS — I10 Essential (primary) hypertension: Secondary | ICD-10-CM | POA: Diagnosis not present

## 2020-06-01 DIAGNOSIS — R21 Rash and other nonspecific skin eruption: Secondary | ICD-10-CM | POA: Diagnosis not present

## 2020-06-01 DIAGNOSIS — Z0189 Encounter for other specified special examinations: Secondary | ICD-10-CM | POA: Diagnosis not present

## 2020-06-01 DIAGNOSIS — E785 Hyperlipidemia, unspecified: Secondary | ICD-10-CM | POA: Diagnosis not present

## 2020-06-01 DIAGNOSIS — E1165 Type 2 diabetes mellitus with hyperglycemia: Secondary | ICD-10-CM | POA: Diagnosis not present

## 2020-06-01 DIAGNOSIS — Z853 Personal history of malignant neoplasm of breast: Secondary | ICD-10-CM | POA: Diagnosis not present

## 2020-06-05 MED FILL — ATENOLOL 50 MG TABLET: 50 | 30 days supply | Qty: 30 | Fill #0

## 2020-06-05 MED FILL — AMMONIUM LACTATE 12% LOTION: 12 | 14 days supply | Qty: 400 | Fill #0

## 2020-06-12 DIAGNOSIS — R21 Rash and other nonspecific skin eruption: Secondary | ICD-10-CM | POA: Diagnosis not present

## 2020-06-12 DIAGNOSIS — E1165 Type 2 diabetes mellitus with hyperglycemia: Secondary | ICD-10-CM | POA: Diagnosis not present

## 2020-06-12 DIAGNOSIS — E785 Hyperlipidemia, unspecified: Secondary | ICD-10-CM | POA: Diagnosis not present

## 2020-06-12 DIAGNOSIS — Z733 Stress, not elsewhere classified: Secondary | ICD-10-CM | POA: Diagnosis not present

## 2020-06-12 DIAGNOSIS — I10 Essential (primary) hypertension: Secondary | ICD-10-CM | POA: Diagnosis not present

## 2020-06-12 DIAGNOSIS — Z853 Personal history of malignant neoplasm of breast: Secondary | ICD-10-CM | POA: Diagnosis not present

## 2020-06-12 DIAGNOSIS — N183 Chronic kidney disease, stage 3 unspecified: Secondary | ICD-10-CM | POA: Diagnosis not present

## 2020-06-12 DIAGNOSIS — Z6834 Body mass index (BMI) 34.0-34.9, adult: Secondary | ICD-10-CM | POA: Diagnosis not present

## 2020-07-14 DIAGNOSIS — Z23 Encounter for immunization: Secondary | ICD-10-CM | POA: Diagnosis not present

## 2020-08-31 MED FILL — ATENOLOL 50 MG TABLET: 50 | 30 days supply | Qty: 30 | Fill #1

## 2021-02-06 ENCOUNTER — Other Ambulatory Visit (HOSPITAL_COMMUNITY): Payer: Self-pay

## 2021-02-06 MED ORDER — FREESTYLE LITE W/DEVICE KIT
PACK | 0 refills | Status: AC
  Filled 2021-02-06: qty 1, 30d supply, fill #0

## 2021-02-07 ENCOUNTER — Other Ambulatory Visit (HOSPITAL_COMMUNITY): Payer: Self-pay

## 2021-02-07 MED ORDER — FREESTYLE LANCETS MISC
2 refills | Status: AC
  Filled 2021-02-07: qty 100, 90d supply, fill #0

## 2021-02-14 ENCOUNTER — Other Ambulatory Visit (HOSPITAL_COMMUNITY): Payer: Self-pay

## 2021-02-15 ENCOUNTER — Other Ambulatory Visit (HOSPITAL_COMMUNITY): Payer: Self-pay

## 2021-03-15 ENCOUNTER — Other Ambulatory Visit (HOSPITAL_COMMUNITY): Payer: Self-pay

## 2022-04-02 IMAGING — DX DG CHEST 1V PORT
1 series · 1 of 1 positions shown · non-contrast
Comparison: 06/10/2011

CLINICAL DATA: DKA

EXAM:
PORTABLE CHEST 1 VIEW

[chest ap]
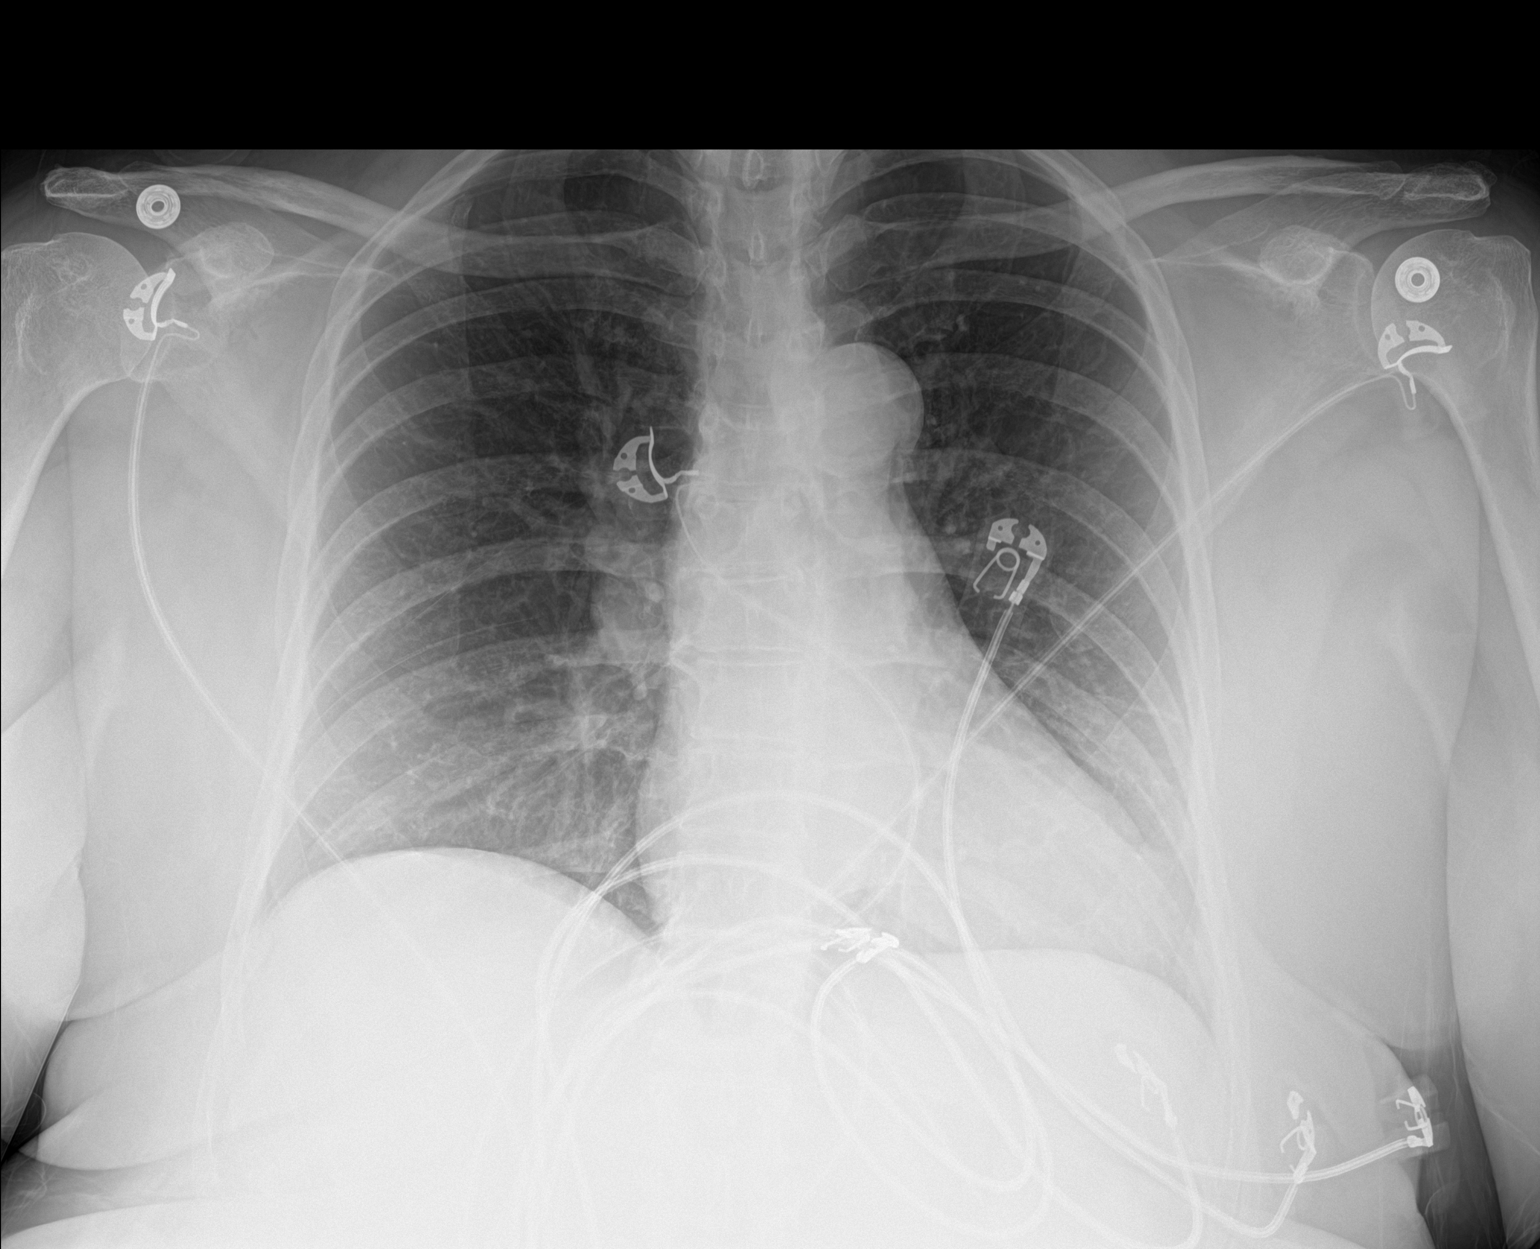

[1 of 1 positions shown; findings below may reference images not displayed]

FINDINGS: Heart and mediastinal contours are within normal limits. No focal
opacities or effusions. No acute bony abnormality.
IMPRESSION: No active disease.

## 2022-05-25 ENCOUNTER — Telehealth (HOSPITAL_COMMUNITY): Payer: Self-pay | Admitting: Physician Assistant

## 2022-05-25 ENCOUNTER — Encounter (HOSPITAL_COMMUNITY): Payer: Self-pay | Admitting: Emergency Medicine

## 2022-05-25 ENCOUNTER — Ambulatory Visit (HOSPITAL_COMMUNITY)
Admission: EM | Admit: 2022-05-25 | Discharge: 2022-05-25 | Disposition: A | Discharge status: Home / Self Care | Payer: Self-pay | Attending: Physician Assistant | Admitting: Physician Assistant

## 2022-05-25 ENCOUNTER — Ambulatory Visit (INDEPENDENT_AMBULATORY_CARE_PROVIDER_SITE_OTHER): Payer: Self-pay

## 2022-05-25 DIAGNOSIS — U071 COVID-19: Secondary | ICD-10-CM | POA: Insufficient documentation

## 2022-05-25 DIAGNOSIS — R051 Acute cough: Secondary | ICD-10-CM | POA: Insufficient documentation

## 2022-05-25 DIAGNOSIS — R059 Cough, unspecified: Secondary | ICD-10-CM

## 2022-05-25 DIAGNOSIS — I10 Essential (primary) hypertension: Secondary | ICD-10-CM | POA: Insufficient documentation

## 2022-05-25 DIAGNOSIS — J069 Acute upper respiratory infection, unspecified: Secondary | ICD-10-CM

## 2022-05-25 LAB — SARS CORONAVIRUS 2 BY RT PCR: SARS Coronavirus 2 by RT PCR: POSITIVE — AB

## 2022-05-25 LAB — BASIC METABOLIC PANEL
Anion gap: 9 (ref 5–15)
BUN: 7 mg/dL — ABNORMAL LOW (ref 8–23)
CO2: 27 mmol/L (ref 22–32)
Calcium: 8.2 mg/dL — ABNORMAL LOW (ref 8.9–10.3)
Chloride: 105 mmol/L (ref 98–111)
Creatinine, Ser: 0.84 mg/dL (ref 0.44–1.00)
GFR, Estimated: >60 mL/min (ref >60)
Glucose, Bld: 125 mg/dL — ABNORMAL HIGH (ref 70–99)
Potassium: 2.9 mmol/L — ABNORMAL LOW (ref 3.5–5.1)
Sodium: 141 mmol/L (ref 135–145)

## 2022-05-25 MED ORDER — ALBUTEROL SULFATE HFA 108 (90 BASE) MCG/ACT IN AERS: 1.0000 | INHALATION_SPRAY | Freq: Four times a day (QID) | RESPIRATORY_TRACT | 0 refills | Status: AC | PRN

## 2022-05-25 MED ORDER — NIRMATRELVIR/RITONAVIR (PAXLOVID)TABLET: 3.0000 | ORAL_TABLET | Freq: Two times a day (BID) | ORAL | 0 refills | Status: AC

## 2022-05-25 MED ORDER — POTASSIUM CHLORIDE ER 10 MEQ PO TBCR: 10.0000 meq | EXTENDED_RELEASE_TABLET | Freq: Every day | ORAL | 0 refills | Status: AC

## 2022-05-25 MED ORDER — BENZONATATE 100 MG PO CAPS: 100.0000 mg | ORAL_CAPSULE | Freq: Three times a day (TID) | ORAL | 0 refills | Status: AC

## 2022-05-25 NOTE — Discharge Instructions (Signed)
Your x-ray showed some bronchial irritation likely related to a virus.  I will contact you with your COVID test result as soon as I have this later today.  In the meantime I would like you to use albuterol [place] every 4-6 hours as needed for shortness of breath and cough.  You can also use over-the-counter medications including Mucinex and Tylenol.  Use Tessalon for cough.  Make sure you are resting and drinking plenty of fluid.  If you have any worsening symptoms including chest pain, shortness of breath, worsening cough, fever, nausea, vomiting you need to be seen immediately.

## 2022-05-25 NOTE — ED Provider Notes (Signed)
[facility]    CSN: [id number] Arrival date & time: 05/25/22  1240      History   Chief Complaint Chief Complaint  Patient presents with   Sore Throat   Cough    HPI Angelica Hogan is a 61 y.o. female.   Patient presents today with a 4 to 5-day history of URI symptoms.  She reports initially having a fever and sore throat but this has resolved.  She is not experiencing hoarseness, drainage, cough, congestion.  Denies any chest pain, shortness of breath, nausea, vomiting.  She has tried DayQuil and NyQuil without improvement of symptoms.  She denies any known sick contacts.  She denies history of allergies, asthma, COPD.  She does not smoke.  Denies any recent antibiotic or steroid.  She has not had COVID in the past.  She has had COVID-19 vaccines.  She does have several risk factors including diabetes and history of malignancy.  She is not currently receiving treatment for cancer.  She is concerned because symptoms have been persisting.    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 breast ([place]) 08/18/2011    Patient Active Problem List   Diagnosis Date Noted   Hyperglycemia 03/30/2020   Hypokalemia 03/30/2020   AKI (acute kidney injury) ([place]) 03/30/2020   High anion gap metabolic acidosis 08/65/7846   Microcytosis 03/30/2020   DKA (diabetic ketoacidoses) 03/30/2020   Breast cancer ([place])    Asymptomatic bacteriuria    Vaginal candidiasis    Malignant neoplasm of central portion of 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      Home Medications    Prior to Admission  medications   Medication Sig Start Date End Date Taking? Authorizing Provider  albuterol (VENTOLIN HFA) 108 (90 Base) MCG/ACT inhaler Inhale 1-2 puffs into the lungs every 6 (six) hours as needed for wheezing or shortness of breath. 05/25/22  Yes Faruq Rosenberger K, PA-C  benzonatate (TESSALON) 100 MG capsule Take 1 capsule (100 mg total) by mouth every 8 (eight) hours. 05/25/22  Yes Olaoluwa Grieder, Derry Skill, PA-C  acetaminophen (TYLENOL) 325 MG tablet Take 650 mg by mouth every 6 (six) hours as needed for moderate pain.     [provider]  atenolol (TENORMIN) 50 MG tablet TAKE 1 TABLET BY MOUTH ONCE DAILY 06/01/20 06/01/21  Rogers Blocker, MD  Biotin 10 MG TABS Take 1 tablet by mouth daily.    [provider]  blood glucose meter kit and supplies Dispense based on patient and insurance preference. Use up to four times daily as directed. (FOR ICD-10 E10.9, E11.9). 04/01/20   Elgergawy, Silver Huguenin, MD  Blood Glucose Monitoring Suppl (FREESTYLE LITE) w/Device KIT use daily as directed 02/05/21     insulin detemir (LEVEMIR FLEXTOUCH) 100 UNIT/ML FlexPen Inject 20 Units into the skin daily. 04/01/20   Elgergawy, Silver Huguenin, MD  Insulin Pen Needle 32G X 4 MM MISC Please use with insulin 04/01/20   Elgergawy, Silver Huguenin, MD  Lancets (FREESTYLE) lancets Use 1 lancet to check blood glucose once daily. 02/07/21     losartan-hydrochlorothiazide (HYZAAR) 100-25 MG tablet Take 1 tablet by mouth daily. 04/01/20   Elgergawy, Silver Huguenin, MD  Multiple Vitamins-Minerals (EYE VITAMINS & MINERALS) TABS Take 1 tablet by mouth daily.    [provider]  Multiple Vitamins-Minerals (ONE-A-DAY WOMENS MIND & BODY) TABS Take 1 tablet by mouth daily.    [provider]  Probiotic Product (PROBIOTIC DAILY PO) Take 1 capsule by mouth daily.     [provider]  Vitamin D, Cholecalciferol, 1000 units TABS Take 1 tablet by mouth once a week.     [provider]    Family History Family History  Problem Relation  Age of Onset   Cancer Maternal Grandmother    Breast cancer Paternal Grandmother    Breast cancer Paternal Aunt    Cancer Paternal Aunt    Breast cancer Paternal Aunt    Breast cancer Cousin    Cancer Cousin    Breast cancer Cousin     Social History Social History   Tobacco Use   Smoking status: Never   Smokeless tobacco: Never  Vaping Use   Vaping Use: Never used  Substance Use Topics   Alcohol use: No   Drug use: Never     Allergies   Patient has no known allergies.   Review of Systems Review of Systems  Constitutional:  Positive for activity change and fatigue. Negative for appetite change and fever (Resolved).  HENT:  Positive for congestion, postnasal drip, sinus pressure, sore throat and voice change. Negative for sneezing and trouble swallowing.   Respiratory:  Positive for cough. Negative for shortness of breath.   Cardiovascular:  Negative for chest pain.  Gastrointestinal:  Negative for abdominal pain, diarrhea, nausea and vomiting.  Neurological:  Negative for dizziness, light-headedness and headaches.     Physical Exam Triage Vital Signs ED Triage Vitals  Enc Vitals Group     BP 05/25/22 1320 (!) 199/94     Pulse Rate 05/25/22 1320 77     Resp 05/25/22 1320 16     Temp 05/25/22 1320 98.8 F (37.1 C)     Temp Source 05/25/22 1320 Oral     SpO2 05/25/22 1320 97 %     Weight --      Height --      Head Circumference --      Peak Flow --      Pain Score 05/25/22 1324 0     Pain Loc --      Pain Edu? --      Excl. in [place]? --    No data found.  Updated Vital Signs BP (!) 173/84 (BP Location: Left Arm)   Pulse 74   Temp 98.8 F (37.1 C) (Oral)   Resp 16   SpO2 98%   Visual Acuity Right Eye Distance:   Left Eye Distance:   Bilateral Distance:    Right Eye Near:   Left Eye Near:    Bilateral Near:     Physical Exam Vitals reviewed.  Constitutional:      General: She is awake. She is not in acute distress.    Appearance: Normal  appearance. She is well-developed. She is not ill-appearing.     Comments: Very pleasant female presented age in no acute distress sitting comfortably in exam room  HENT:     Head: Normocephalic and atraumatic.     Right Ear: Tympanic  membrane, ear canal and external ear normal. Tympanic membrane is not erythematous or bulging.     Left Ear: Tympanic membrane, ear canal and external ear normal. Tympanic membrane is not erythematous or bulging.     Nose:     Right Sinus: No maxillary sinus tenderness or frontal sinus tenderness.     Left Sinus: No maxillary sinus tenderness or frontal sinus tenderness.     Mouth/Throat:     Pharynx: Uvula midline. No oropharyngeal exudate or posterior oropharyngeal erythema.  Cardiovascular:     Rate and Rhythm: Normal rate and regular rhythm.     Heart sounds: Normal heart sounds, S1 normal and S2 normal. No murmur heard. Pulmonary:     Effort: Pulmonary effort is normal.     Breath sounds: Examination of the right-lower field reveals decreased breath sounds. Examination of the left-lower field reveals decreased breath sounds. Decreased breath sounds present. No wheezing, rhonchi or rales.     Comments: Decreased aeration bilateral bases Musculoskeletal:     Right lower leg: No edema.     Left lower leg: No edema.  Psychiatric:        Behavior: Behavior is cooperative.      UC Treatments / Results  Labs (all labs ordered are listed, but only abnormal results are displayed) Labs Reviewed  SARS CORONAVIRUS 2 BY RT PCR  BASIC METABOLIC PANEL    EKG   Radiology DG Chest 2 View  Result Date: 05/25/2022 CLINICAL DATA:  Cough. EXAM: CHEST - 2 VIEW COMPARISON:  One view chest x-ray 03/30/2020 FINDINGS: Heart size scratched at the heart is upper limits of normal. Mild central airway thickening is present. No significant airspace consolidation present. No pleural effusions are present. The visualized soft tissues and bony thorax are unremarkable.  IMPRESSION: Mild central airway thickening without focal airspace disease. This is nonspecific, but likely represents an acute viral process or reactive airways disease. Electronically Signed   By: San Morelle M.D.   On: 05/25/2022 14:19    Procedures Procedures (including critical care time)  Medications Ordered in UC Medications - No data to display  Initial Impression / Assessment and Plan / UC Course  I have reviewed the triage vital signs and the nursing notes.  Pertinent labs & imaging results that were available during my care of the patient were reviewed by me and considered in my medical decision making (see chart for details).     X-ray was obtained that showed bronchial thickening likely related to a virus.  Suspect COVID-19.  COVID-19 testing is obtained and pending.  Patient is a candidate for antiviral therapy given her history and age if she is positive.  BMP was obtained today to monitor her kidney function to determine if Paxlovid is appropriate.  No evidence of acute infection lab work initiation of antibiotics.  Will defer steroids as patient has a history of diabetes.  She was started on albuterol inhaler and Tessalon for cough.  Can use over-the-counter medications including Mucinex, Flonase, Tylenol.  She is to rest and drink plenty of fluid.  If her symptoms are improving she is to return for reevaluation.  If anything worsen she needs to be seen immediately which she expressed understanding.  Strict return precautions given.  Work excuse note provided.  Patient's blood pressure was elevated today.  Patient denies any headache, chest pain, shortness of breath, vision change, dizziness.  Suspect this is related to decongestant use as patient has been taking DayQuil and NyQuil.  Recommended  that she discontinue these medications.  She is also to avoid NSAIDs, caffeine, sodium.  Recommend she monitor her blood pressure at home and if persistently elevated she needs to be  seen immediately.  Recommend follow-up with primary care next week for recheck.  If she develops any chest pain, shortness of breath, headache, vision change, dizziness in setting of her blood pressure she is to go to the emergency room to which she expressed understanding.  Final Clinical Impressions(s) / UC Diagnoses   Final diagnoses:  Upper respiratory tract infection, unspecified type  Acute cough  Elevated blood pressure reading in office with diagnosis of hypertension     Discharge Instructions      Your x-ray showed some bronchial irritation likely related to a virus.  I will contact you with your COVID test result as soon as I have this later today.  In the meantime I would like you to use albuterol [place] every 4-6 hours as needed for shortness of breath and cough.  You can also use over-the-counter medications including Mucinex and Tylenol.  Use Tessalon for cough.  Make sure you are resting and drinking plenty of fluid.  If you have any worsening symptoms including chest pain, shortness of breath, worsening cough, fever, nausea, vomiting you need to be seen immediately.     ED Prescriptions     Medication Sig Dispense Auth. Provider   albuterol (VENTOLIN HFA) 108 (90 Base) MCG/ACT inhaler Inhale 1-2 puffs into the lungs every 6 (six) hours as needed for wheezing or shortness of breath. 8 g Grae Cannata K, PA-C   benzonatate (TESSALON) 100 MG capsule Take 1 capsule (100 mg total) by mouth every 8 (eight) hours. 21 capsule Kailoni Vahle K, PA-C      PDMP not reviewed this encounter.   Terrilee Croak, PA-C 05/25/22 1501

## 2022-05-25 NOTE — ED Triage Notes (Signed)
Wednesday began having cough with yellow/brown mucus, sore throat, chills, nasal congestion. Presents with a hoarse voice. Has been taking dayquil and nyquil.

## 2022-05-25 NOTE — Telephone Encounter (Signed)
Called patient to discuss results.  Prescriptions for potassium and Paxlovid sent to pharmacy.  See result note for additional information.

## 2023-05-29 ENCOUNTER — Other Ambulatory Visit (HOSPITAL_COMMUNITY): Payer: Self-pay
